# Patient Record
Sex: Female | Born: 1955 | Race: White | Marital: Married | Smoking: Never smoker
Health system: Northeastern US, Academic
[De-identification: ages and names within clinical notes are randomized; demographics above are authoritative.]

## PROBLEM LIST (undated history)

## (undated) DIAGNOSIS — G20 Parkinson's disease: Secondary | ICD-10-CM

## (undated) HISTORY — PX: APPENDECTOMY: SHX54

## (undated) HISTORY — DX: Parkinson's disease: G20

## (undated) HISTORY — PX: ANTERIOR CRUCIATE LIGAMENT REPAIR: SHX115

---

## 2013-09-06 ENCOUNTER — Other Ambulatory Visit: Payer: Self-pay | Admitting: Gastroenterology

## 2013-09-07 ENCOUNTER — Other Ambulatory Visit: Payer: Self-pay | Admitting: Gastroenterology

## 2013-11-01 ENCOUNTER — Ambulatory Visit: Payer: Self-pay | Admitting: Neurology

## 2013-11-01 ENCOUNTER — Encounter: Payer: Self-pay | Admitting: Neurology

## 2013-11-01 ENCOUNTER — Encounter: Payer: Self-pay | Admitting: Gastroenterology

## 2013-11-01 VITALS — BP 132/68 | HR 77 | Ht 66.0 in | Wt 135.0 lb

## 2013-11-01 DIAGNOSIS — G20 Parkinson's disease: Secondary | ICD-10-CM

## 2013-11-01 MED ORDER — CARBIDOPA-LEVODOPA 25-100 MG PO TABS *I*
1.0000 | ORAL_TABLET | Freq: Three times a day (TID) | ORAL | Status: DC
Start: 2013-11-01 — End: 2014-03-07

## 2013-11-01 NOTE — Patient Instructions (Signed)
It sounds like it would be a good idea to start Sinemet (the generic name is carbidopa/levodopa).  This is a medication that is taken up into the brain and made into dopamine (which is lacking in Parkinson's disease).  It is generally our most effective medication and is generally well tolerated.  The most common side effects are nausea or dizziness upon first standing up.  Any PD medication can cause confusion or hallucinations but these are rare early in the illness.  The medication is started at a very low dosage and then titrated up gradually.  We generally increase gradually to an initial goal of one tablet 3 times per day after a couple of weeks but you might end up needing a higher dosage (such as 2 tablets 3 times per day) - everyone is different.  You start with a 1/2 tablet in the morning for 3 days, then increase to 1/2 tablet twice a day for 3 days, then 1/2 tablet 3 times per day for 3 days, etc. As outlined in the table below.      Carbidopa/Levodopa (Sinemet) 25/100 Strength Tablet Titration Schedule    Day  AM NOON  PM    1-3 1/2      4-6 1/2  1/2    7-9 1/2 1/2 1/2    10-12 1 1/2 1/2    13-15 1 1/2 1    16  on 1 1 1      National Parkinson's foundation ([email or web address])    [facility] are both good websites to look at for information for Parkinson's disease.

## 2013-11-01 NOTE — Progress Notes (Addendum)
Movement Disorders Clinic-New Patient Visit    Dear Dr. Ike BeneGaffney,    CC: Tremors          We had the pleasure of seeing your patient Linda Arroyo in our [facility] today.  As you know she is a 58 y.o. right handed female who was referred to our clinic for evaluation of hand tremors (R>L) that started about 1 year ago.  She notices that it can occur at rest but also with writing (gets small) and difficulty with fine motor tasks such as buttoning and zippering but can still perform these.  She does not notice worsening of tremor when bringing a fork or spoon to her mouth but has some difficulty with twirling noodles on a fork.   She only has a drink of wine 1-2 times a month and has not noticed any decrease in tremor with alcohol.   She was started on propranolol about 6 months ago by her PCP and takes 10mg  on average once a week.  She takes it when she feels anxious or nervous but has not noticed much benefit with this. No family history of Parkinson's disease or essential tremor.         She also describes a dull aching in her forearms and tops of her legs and sometimes catches her foot on the stairs though because she did not lift her foot high enough.  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  She noticed in November right leg tripping up the stairs.  Her husband notes that she has been more apprehensive about riding her motorcycle and recently sold her motorcycle because of this.  He also notes that she seems slower to execute movements.  She says when she is tired she moves slower.    Symptom Review:  Falls or difficulty with balance: No falls occasional if side steps comes to close falling.  Orthostatic symptoms: None   Difficulty with memory: None  Hallucinations:None  Constipation: Yes only 1 BM a week on average especially the past few months.  She feels stool softeners have helped slightly but not increased  frequency of movements.  Urinary urgency/frequency: None  Incontinence:None  Difficulty with sleep:No difficulty falling asleep, but has been waking up during the night due to lower back pain from a buldging disc and has had PT which has helped.  This was recommended by a neurosurgeon.  Vividness or acting out of dreams: She talks in her sleep and moves around at night worsened since back pain.  Depression: None  Anxiety:None  Any difficulty with impulse control:None    16 point ROS was reviewed and scanned into the electronic medical record.    Medical History  Tremor  Past Surgical History   Procedure Laterality Date    Anterior cruciate ligament repair      Appendectomy         Medications  Current Outpatient Prescriptions   Medication Sig    Omega-3 Fatty Acids (OMEGA-3 FISH OIL PO) Take 2 capsules by mouth 2 times daily    CALCIUM LACTATE PO Take by mouth    carbidopa-levodopa (SINEMET) 25-100 MG per tablet Take 1 tablet by mouth 3 times daily       Allergies  Allergies   Allergen Reactions    Fish-Derived Products Anaphylaxis     Sea bass anaphylactic shock    Penicillins Hives  Family History  Family History   Problem Relation Age of Onset    Stroke Mother     Dementia Mother     Dementia Maternal Grandmother     Stroke Paternal Grandmother        Social History  History   Substance Use Topics    Smoking status: Never Smoker     Smokeless tobacco: Not on file    Alcohol Use: 0.6 oz/week     0 Not specified, 1 Glasses of wine per week      Comment: 1-2 glasses of wine per month       Exam  Filed Vitals:    11/01/13 0855   BP: 132/68   Pulse: 77   Height:    Weight:    Patient was in the OFF state  Mental Status: she had normal comprehension and fund of knowledge  Cranial Nerves: Extraocular muscles were full range, normal saccades, and no nystagmus.  Face was symmetric and there was mild hypomimia, no significant hypophonia, or dysarthria.  Tongue was midline and palate elevated equally.    Motor: Muscle bulk was normal with mild increased rigidity in the right upper extremity.  Finger taps, pronation/supination, heel taps were slowed right greater then left.  There was no resting, action, or postural tremor present on our exam today. There was mild bradykinesia.  Sensory: Intact to light touch and vibration throughout.  Reflexes:Biceps 2+/2+, brachioradialis 2+/2+, triceps 2+/2+, patellar 2+/2+, achilles 2+/2+.   Coordination: Finger nose finger was intact.  Gait: she was able to stand with arms crossed from a seated position, retropulsion was seen on pull test but recovered unaided. Gait was slightly slowed with mild decrease in arm swing on the right.    PART III MOTOR EXAMINATION  INDICATE MEDICATION STATE:: Not on medications.  18. SPEECH:: Normal   19.  FACIAL EXPRESSION:: Normal  20. A.) TREMOR AT REST: FACE, LIPS + CHIN: Normal  20.B.) Tremor at Rest: Right Hand:: Normal  20. C) Tremor at Rest: LEFT HAND:: Normal  20. D.) Tremor at Rest: RIGHT FOOT:: Normal  20. E.) Tremor at Rest: LEFT FOOT:: Normal  21. A.) Action or Postural Tremor of Hands: RIGHT HAND:: Absent  21. B.)  Action or Postural Tremor of Hands: LEFT HAND:: Slight, present with action.  22. A.) Rigidity:  Neck:: Mild-to-moderate.  22. B.) Rigidity:  RUE:: Mild-to-moderate.  22. C.) Rigidity: LUE:: Slight or detectable only when activated by mirror or other movements.  22. D.)  Rigidity:  RLE:: Slight or detectable only when activated by mirror or or other movements.  22. E.) Rigidity:  LLE:: Slight or detectable only when actiivated by mirror or other movements.  23. A.) Bradykinesia: Finger Taps Right Hand:: Mild slowing and/or reduction in amplitude  23. B.) Bradykinesia: Finger Taps: LEFT HAND:: Mild slowing and/or reduction in amplitude  24. A.)  Bradykinesia:  RIGHT HAND MOVEMENTS:: Mild slowing and/or reduction in amplitude.  24. B.) Bradykinesia: LEFT HAND MOVEMENTS:: Normal  25. A.) Bradykinesia: RAPID ALTERNATING  MOVEMENTS OF RIGHT HAND (PRONATION-SUPINATION):: Normal  25 B.) Bradykinesia: RAPID ALERNATING MOVEMENTS OF LEFT HAND (PRONATION-SUPINATION):: Normal  26. A.)  Bradykinesia: RIGHT LEG AGILITY:: Mild slowing and/or reduction in amplitude.  26. B.) Bradykinesia: LEFT LEG AGILITY:: Mild slowing and/or reduction in amplitude.  27.  ARISING FROM CHAIR:: Normal  28.  POSTURE:: Normal erect  29. GAIT:: Normal  30.  POSTURAL STABILITY:: Retropulsion, but recovers unaided.  31. BODY BRADYKINESIA AND HYPOKINESIA:: Minimal slowness, giving,  movement a deliberate character, could be normal for some persons. Possibly, reduced amplitude.  PART III   TOTAL:: 15    Assessment  1. Probable PD.  Mild-moderately impaired finger and foot tapping, right greater than left, mild RUE rigidity and report of tremor by history (though not observed on exam today).  We discussed approaches toward therapy and, given the functional impact of her symptoms, decided to start Sinemet. We answered all her questions regarding PD today and sent the script to her pharmacy for sinemet.    Plan  -start sinemet 25/100mg  and titrate up to 1 tab TID over 2-3 weeks as directed  -further increases as necessary and tolerated  -follow up in clinic in 3 months     Thank you for allowing us to participate in the care of your patient.    Sincerely,    Tacey RuizKelly Andrzejewski D.O., Ph.D.      Patient was seen with attending physician Dr. Gerlene Burdockichard.    Neurology Attending Addendum    I saw and evaluated the patient. I agree with the fellow's findings and plan of care as documented above.    Glena NorfolkIRENE Jackelyn Illingworth, MD

## 2013-12-05 ENCOUNTER — Telehealth: Payer: Self-pay | Admitting: Neurology

## 2013-12-05 NOTE — Telephone Encounter (Signed)
Spoke with Linda Arroyo. She is wondering if she needs to continue taking her propanolol for tremor now that she was started on c/l and her symptoms including her tremor have improved. Discussed that propanolol is a beta blocker that works in the heart and lowers BP, and HR but also helps with tremor. Advised she continues c/l and not take propanolol as it may lower BP and HR, as c/l can lower BP as well. Advised she calls her PCP who prescribed the propanolol and checks with him if she even needs to keep taking it.

## 2013-12-05 NOTE — Telephone Encounter (Signed)
Pt has questions regarding meds please call her at [phone]

## 2014-03-07 ENCOUNTER — Ambulatory Visit: Payer: Self-pay | Admitting: Neurology

## 2014-03-07 ENCOUNTER — Encounter: Payer: Self-pay | Admitting: Neurology

## 2014-03-07 VITALS — BP 134/68 | HR 94 | Ht 66.0 in | Wt 133.0 lb

## 2014-03-07 DIAGNOSIS — G20 Parkinson's disease: Secondary | ICD-10-CM | POA: Insufficient documentation

## 2014-03-07 MED ORDER — CARBIDOPA-LEVODOPA 25-100 MG PO TABS *I*
1.0000 | ORAL_TABLET | Freq: Three times a day (TID) | ORAL | Status: DC
Start: 2014-03-07 — End: 2014-12-03

## 2014-03-07 NOTE — Patient Instructions (Addendum)
For your sleep, you can try melatonin at bedtime, which you can get over-the-counter.  Start with 3mg  at bedtime.  You can increase by 3mg  every 4-5 days as needed to a maximum of 12mg  at bedtime.    Continue exercising regularly!

## 2014-03-07 NOTE — Progress Notes (Signed)
Dear Dr. Tinnie GensKlepack,    We recently had the opportunity to see Linda Arroyo at the [facility] for follow up of Parkinson's disease.  She is accompanied by her husband.    She was last seen here in October of 2015 at which time she was started on carbidopa-levodopa 25-100mg  1 tab TID for probable Parkinson's disease.  Since then, things have been going really well.  She is moving pretty well.  She has a ruptured disc in her lower back so is often stiff in her back in the morning and at the end of the day.  She says her resting tremor may be a little more pronounced but tremors with action have lessened.    She denies constipation, urinary problems.  She sleeps "pretty good."  She wakes up during the night but is unsure why.  She does shout out in sleep.  She denies depression or anxiety.  She denies changes in thinking or memory--she is no longer having "fogginess" of thinking.  She walks for exercise and is doing physical therapy for her back.    Current Parkinson disease medications:   Carbidopa-levodopa 25-100mg  1 tab TID   She is tolerated the medication well.  She denies dyskinesias, hallucinations, nausea or lightheadedness with standing.    The patient's medications, allergies, and active problem list were reviewed and updated as appropriate elsewhere in the electronic medical record.    REVIEW OF SYSTEMS: She completed a 15 point review of systems questionnaire that I reviewed today and that will be scanned into the electronic medical record.    EXAMINATION:  Filed Vitals:    03/07/14 1247 03/07/14 1249   BP: 146/88 134/68   Pulse: 75 94   Height: 1.676 m (5\' 6" )    Weight: 60.328 kg (133 lb)        GENERAL EXAM:  She appears comfortable.    NEUROLOGICAL EXAM:  MENTAL STATUS:  She is awake and alert.  She is oriented to conversation. Speech is fluent.  Comprehension is intact.  Affect is appropriate.      CRANIAL NERVES:  Ocular versions are full and conjugate.  There is no nystagmus.   Pursuits are smooth.  Saccades are intact.  Facial expression is symmetric.  There is no hypomimia and no hypophonia.  The palate elevates symmetrically with phonation.  There is no dysarthria. Shoulder shrug is symmetric.  The tongue protrudes in the midline.    MOTOR:  There is mild rigidity in the right wrist with augmentation only.  There was no rigidity in the lower extremities.   Finger taps, hand open-close, and pronation supination are mildly impaired, right greater than left. Toe taps and heel taps are normal bilaterally. There are no rest, action or postural tremors.    COORDINATION:  No ataxia evident on finger-nose-finger testing.    GAIT:  She is able to rise from a chair with arms crossed without difficulty.  Casual gait is stable with normal base and stride length.  Arm swing is reduced on the right greater than left.  Takes 1 step backwards on pull test.    IMPRESSION:  Linda Arroyo is a 59 y.o. female with probable idiopathic Parkinson's disease, doing well on low-dosage Sinemet.  She is having some difficultly staying asleep and endorses mild REM sleep behavior disorder symptoms.    PLAN:  - continue Sinemet 1 tab TID  - start melatonin 3mg  at bedtime to help with sleep; this can be increased in   increments up to  at bedtime as needed  - encouraged exercise  - follow up in 6 months    Thank you for the opportunity to provide care for your patient, who was seen with attending physician, Dr. Glena Norfolk.      Duanne Guess, MD  Movement Disorders Fellow    Neurology Attending Addendum    I saw and evaluated the patient. I agree with the fellow's findings and plan of care as documented above.    Glena Norfolk, MD

## 2014-05-16 ENCOUNTER — Inpatient Hospital Stay: Admit: 2014-05-16 | Discharge: 2014-05-16 | Disposition: A | Discharge status: Home / Self Care | Payer: Self-pay

## 2014-06-24 ENCOUNTER — Other Ambulatory Visit: Payer: Self-pay | Admitting: Neurology

## 2014-12-03 ENCOUNTER — Ambulatory Visit: Payer: Self-pay | Admitting: Neurology

## 2014-12-03 ENCOUNTER — Encounter: Payer: Self-pay | Admitting: Neurology

## 2014-12-03 VITALS — BP 109/74 | HR 91 | Ht 66.0 in | Wt 139.0 lb

## 2014-12-03 DIAGNOSIS — G20 Parkinson's disease: Secondary | ICD-10-CM

## 2014-12-03 MED ORDER — CARBIDOPA-LEVODOPA 25-100 MG PO TABS *I*
1.0000 | ORAL_TABLET | Freq: Three times a day (TID) | ORAL | 4 refills | Status: DC
Start: 2014-12-03 — End: 2015-12-03

## 2014-12-03 NOTE — Patient Instructions (Signed)
No changes to your medications.    Your blood pressure today. We recommend you drink 8 glasses of water day and monitor your BP for a week or so. Call if you experience more dizziness or you faint.

## 2014-12-03 NOTE — Progress Notes (Signed)
Dear Dr. Tinnie Gens,  Movement Disorder Clinic Follow Up Visit  We had the pleasure of seeing your patient Linda Arroyo in the Movement disorders clinic unaccompanied for follow up of Parkinson disease.      HPI  Linda Arroyo was last seen in February at which time no medication changes were made. Since then she thinks her symptoms are about the same.  She is not experiencing any side effects. She thinks her tremors is activated first thing in the morning, if she is tired, stressed or at at the end of the day.  She continues to stay active by walking 4 miles several times a week.  She also does yoga and modifies the poses as needed. She feels well and thinks the medicine is helpful. She is taking carbidopa/levodopa 25/100 1 tablet three times daily. She denies fall, LH, hallucinations, or impulse control problems.     Current Parkinson disease medications:   Carbidopa-levodopa 25-100mg  1 tab TID   She is tolerated the medication well. She denies dyskinesias, hallucinations, nausea or lightheadedness with standing    Symptom Review:  Difficulties swallowing/dysphagia: No choking   Falls or difficulty with balance: No  Orthostatic symptoms: dizziness a couple of spells during the summer, but none recently  Difficulty with memory: word finding   Hallucinations: No  Constipation: No  Urinary urgency/frequency: No, 1x up to use the batroom  Incontinence: No  Difficulty with sleep: falling asleep   Vividness or acting out of dreams: talks in her sleep  Depression: No  Anxiety: some anxiety  Any difficulty with impulse control: No  Any fluctuations in medication response: No  Dyskinesias: No    Review of Systems   She completed a 15 point review of systems questionnaire that I reviewed today and that will be scanned into the electronic medical record.     Medications  Current Outpatient Prescriptions   Medication Sig    carbidopa-levodopa (SINEMET) 25-100 MG per tablet TAKE 1 TABLET BY MOUTH THREE TIMES DAILY    carbidopa-levodopa  (SINEMET) 25-100 MG per tablet Take 1 tablet by mouth 3 times daily    Omega-3 Fatty Acids (OMEGA-3 FISH OIL PO) Take 2 capsules by mouth 2 times daily    CALCIUM LACTATE PO Take by mouth       Exam  Vitals:    12/03/14 1431 12/03/14 1437   BP: 118/79 109/74   BP Location: Right arm Right arm   Patient Position: Sitting Standing   Cuff Size: adult adult   Pulse: 88 91   Weight: 63 kg (139 lb)    Height: 1.676 m ( )        Mental Status:  Alert and fully oriented with normal language and comprehension.  Mood euthymic, no evidence of psychosis    Cranial Nerves: Ocular versions are full and conjugate. There is no nystagmus.  Pursuits are smooth. There is no hypomimia and no hypophonia. There is no dysarthria.     Motor: There is mild rigidity in the right wrist with augmentation only. There was no rigidity in the lower extremities. Finger taps, hand open-close, and pronation supination are mildly impaired, right greater than left. Heel taps taps are normal bilaterally. There are no rest, action or postural tremors.    Coordination: No ataxia evident on finger-nose-finger testing.    Gait: She is able to rise from a chair with arms crossed without difficulty. Casual gait is stable with normal base and stride length. Arm swing is reduced on the right  greater than left. She takes 1 step backwards on pull test.    IMPRESSION:  Linda Arroyo is a 59 y.o. female with probable idiopathic Parkinson's disease, doing well on low-dosage Sinemet. She continues to have diffiuclty staying asleep and endorses mild REM sleep behavior disorder symptoms.     Plan  1) continue Sinemet 1 tab TID    2) Increase Melatonin to 6 mg for REM sleep behavior disorder    3) Encouraged exercise      We have asked Linda Arroyo to return for follow up in 6 months but to call sooner with concerns or questions.    Thank you for allowing me to participate in the care of your patient.    Sincerely,    Sande BrothersAida Mychal Decarlo, AGNP

## 2015-06-02 ENCOUNTER — Ambulatory Visit: Payer: Self-pay | Admitting: Neurology

## 2015-06-18 ENCOUNTER — Encounter: Payer: Self-pay | Admitting: Neurology

## 2015-06-18 ENCOUNTER — Ambulatory Visit: Payer: Self-pay | Admitting: Neurology

## 2015-06-18 VITALS — BP 131/67 | HR 80 | Ht 66.0 in | Wt 138.0 lb

## 2015-06-18 DIAGNOSIS — G20 Parkinson's disease: Secondary | ICD-10-CM

## 2015-06-18 NOTE — Patient Instructions (Addendum)
We recommend you add Miralax daily initially and if you get too regular, may reduce to 3 times a week.     Continue to drink plenty of water and exercise daily for constipation.    Consider the [facility] [place].

## 2015-06-18 NOTE — Progress Notes (Signed)
Dear Dr. Tinnie Arroyo,  Movement Disorder Clinic Follow Up Visit  We had the pleasure of seeing your patient Linda Arroyo in the Movement disorders clinic accompanied for follow up of Parkinson disease.       HPI  Linda Arroyo was last seen in November 2016, at which time no medication changes were made. Since then, she thinks her symptoms are about the same.  She participated in the movement study through the [employer] [facility] at the [facility] in [place].  She found study to be a great experience and learned a lot about Parkinson's disease and the role of exercise and helping to keep the disease at the.  She is motivated to continue to exercise and strengthen her legs.  She thinks her tremor is activated first thing in the morning, if she is tired, stressed or at at the end of the day.  She continues to stay active by walking 4 miles several times a week. She feels well and thinks the medicine is helpful. She is taking carbidopa/levodopa 25/100 1 tablet three times daily. She denies problems swallowing, falls, hallucinations, or impulse control problems.  Her mood is good and is very optimistic.    Current Parkinson disease medications:   Carbidopa-levodopa 25-100mg  1 tab TID   She is tolerated the medication well. She denies dyskinesias, hallucinations,   nausea or lightheadedness with standing.      Symptom Review:  Difficulties swallowing/dysphagia: No  Falls or difficulty with balance: No  Orthostatic symptoms: occasionally she feels dizziness, very short  Difficulty with memory: No  Hallucinations: No  Constipation: yes, Magnesium  Urinary urgency/frequency: No  Incontinence: No  Difficulty with sleep:still some, Melatonin 10 mg  Vividness or acting out of dreams: No  Depression: No  Anxiety: once in a while she is anxious about work projects, but does not impact function or performance at work  Any difficulty with impulse control: No  Any fluctuations in medication response: No  Dyskinesias:  No    Review of Systems   She completed a 15 point review of systems questionnaire that I reviewed today and that will be scanned into the electronic medical record.     Medications  Current Outpatient Prescriptions   Medication Sig    carbidopa-levodopa (SINEMET) 25-100 MG per tablet Take 1 tablet by mouth 3 times daily    Omega-3 Fatty Acids (OMEGA-3 FISH OIL PO) Take 2 capsules by mouth 2 times daily    CALCIUM LACTATE PO Take by mouth       Exam  Vitals:    06/18/15 1448 06/18/15 1450   BP: 138/73 131/67   BP Location: Left arm Left arm   Patient Position: Sitting Standing   Cuff Size: adult adult   Pulse: 70 80   Weight: 62.6 kg (138 lb)    Height: 1.676 m (5\' 6" )        Mental Status:  Alert and fully oriented with normal language and comprehension.  Mood euthymic, no evidence of psychosis    Cranial Nerves: Ocular versions are full and conjugate. There is no nystagmus. Pursuits are smooth. There is no hypomimia and no hypophonia. There is no dysarthria.     Motor: There is mild rigidity in the right wrist with augmentation only. There was no rigidity in the lower extremities. Finger taps, hand open-close, pronation supination, are mildly impaired, left greater than right. Heel and toe taps are normal bilaterally. There are no rest, action or postural tremors.  Coordination: No ataxia evident on finger-nose-finger testing.    Gait: She is able to rise from a chair with arms crossed without difficulty. Casual gait is stable with normal base and stride length. Arm swing is reduced on the right greater than left. She takes 1 step backwards on pull test.    IMPRESSION:  Linda Arroyo is a 60 y.o. female with probable idiopathic Parkinson's disease, doing well on her current Sinemet regimen. She continues to have diffiuclty staying asleep and endorses mild REM sleep behavior disorder symptoms. We suggest she increases Melatonin to 12 mg for RBD. For constipation, suggested increased fluids, fiber and  daily Miralax. We encouraged her to continue her daily exercise program.     Plan  1) Continue Sinemet 1 tab TID    2) Increase Melatonin to 12 mg for REM sleep behavior disorder    3)  For constipation, suggested increased fluids, fiber and daily Miralax.      We have asked Linda Arroyo to return for follow up in 6 months but to call sooner with concerns or questions.    Thank you for allowing me to participate in the care of your patient.    Sincerely,    Sande Brothers, AGNP

## 2015-12-03 ENCOUNTER — Other Ambulatory Visit: Payer: Self-pay | Admitting: Neurology

## 2015-12-03 DIAGNOSIS — G20 Parkinson's disease: Secondary | ICD-10-CM

## 2015-12-03 MED ORDER — CARBIDOPA-LEVODOPA 25-100 MG PO TABS *I*
1.0000 | ORAL_TABLET | Freq: Three times a day (TID) | ORAL | 4 refills | Status: DC
Start: 2015-12-03 — End: 2016-03-25

## 2016-01-01 ENCOUNTER — Ambulatory Visit: Payer: Self-pay | Admitting: Neurology

## 2016-03-25 ENCOUNTER — Other Ambulatory Visit: Payer: Self-pay | Admitting: Neurology

## 2016-03-25 DIAGNOSIS — G20 Parkinson's disease: Secondary | ICD-10-CM

## 2016-05-11 ENCOUNTER — Ambulatory Visit: Payer: Self-pay | Admitting: Neurology

## 2016-05-24 ENCOUNTER — Ambulatory Visit: Payer: PRIVATE HEALTH INSURANCE | Attending: Neurology | Admitting: Neurology

## 2016-05-24 ENCOUNTER — Encounter: Payer: Self-pay | Admitting: Neurology

## 2016-05-24 VITALS — BP 121/77 | HR 81 | Ht 66.0 in | Wt 138.0 lb

## 2016-05-24 DIAGNOSIS — G20 Parkinson's disease: Secondary | ICD-10-CM

## 2016-05-24 NOTE — Progress Notes (Addendum)
[facility] Follow-up Note    Dear Dr. Albina BilletKlepack, William, MD,    We had the pleasure of seeing Linda Arroyo, a pleasant 61 y.o. woman in follow-up in the [facility] Disorders [facility] today for Parkinson's Disease. She was accompanied by her husband.    She was previously assessed in May 2017 and she has remained stable since. There has been no significant progression or worsening of her symptoms. Her tremor is well controlled. There is no imbalance and there have been no falls. She denies dysphagia. There have been no hallucinations, confusion, impulsive behaviors, or dyskinesias. She continues to take 1 tab of Sinemet TID and there is no wearing-off.     She reports occasional orthostatic dizziness. She has "some anxiety" which is not too bad. No depression. She has had some symptoms of RBD in the past which have not recurred recently. She takes melatonin occasionally. Her constipation is also well-controlled right now.    She tries to stay active and goes on walks regularly.    Current Parkinson disease medications:   Carbidopa-levodopa 25-100mg  1 tab TID       Home Medications:  Prior to Admission medications    Medication Sig Start Date End Date Taking? Authorizing Provider   traMADol (ULTRAM) 50 MG tablet Take 50 mg by mouth as needed for Pain   Yes [provider]   carbidopa-levodopa (SINEMET) 25-100 MG per tablet TAKE 1 TABLET BY MOUTH THREE TIMES DAILY 03/25/16  Yes Leretha PolSantiago, Aida L, NP   Omega-3 Fatty Acids (OMEGA-3 FISH OIL PO) Take 2 capsules by mouth 2 times daily   Yes [provider]   CALCIUM LACTATE PO Take by mouth   Yes [provider]   MAGNESIUM LACTATE PO Take 400 mg by mouth daily    [provider]        ROS : Twelve point review of systems completed and reviewed with patient with pertinent positives reported above. Please see scanned ROS page in Media section for further detail.     Objective:  Vitals:    05/24/16 1110 05/24/16 1113   BP: 148/84  121/77   BP Location: Left arm Left arm   Patient Position: Sitting Standing   Cuff Size: adult adult   Pulse: 68 81   Weight: 62.6 kg (138 lb)    Height: 1.676 m (5\' 6" )      Gen: Sitting in chair comfortably; NAD.    Neurological Examination: Examination is conducted approximately 4 hours after patient's previous dose of parkinson's medication(s).     Mental Status:  She is awake and alert with normal attention. Language function is judged to be normal.    Cranial Nerves:  Pupils are both equal, round and reactive to light and extraocular muscles are intact. Vertical gaze is intact. Ocular pursuits and saccadic eye movements are normal. There is no nystagmus. There is no facial asymmetry. There is slight hypomimia and perhaps slight hypophonia. There is no rigidity of the neck musculature. Shoulder shrug is symmetric bilaterally.    Motor: There is normal muscle bulk. There is no focal weakness of the extremities. There is slight rigidity at the UEs with augmentation.  Finger taps and hand opening/closure are mild-to-mod impaired on the right and slightly impaired on the left. Pronation-supination are slightly slow bilaterally. Heel and toe taps are normal. There is no significant rest, postural or kinetic tremor. There are no dyskinesias.    Sensory: There are no gross sensory abnormalities to light  touch.    Coordination: There is no dysmetria on FTN.    Gait: She is able to rise from chair with arms crossed. Gait is comprised of normal initiation and stride length with mildly reduced arm swing. Turning is stable. Pull test negative.      IMPRESSION AND PLAN:  Linda Arroyo is a 61 y.o. woman with probable idiopathic Parkinson's Disease who is currently doing very well. She is on low-dose Sinemet (1 tab TID) and tolerating it without difficulty. She denies any wearing-off. We have therefore asked her to continue with the same dose of Sinemet.    She does have some orthostatic drop in her blood pressure, for which  we have suggested increasing fluid intake. We will plan to reassess her in about 6 months.     The patient was seen and the plan was discussed with the attending physician, Dr. Gerlene Burdock.    Adolph Pollack, MD  Movement Disorders Fellow    Neurology Attending Addendum    I saw and evaluated the patient. I agree with the fellow's findings and plan of care as documented above.    Glena Norfolk, MD

## 2016-06-02 ENCOUNTER — Inpatient Hospital Stay: Admit: 2016-06-02 | Discharge: 2016-06-02 | Disposition: A | Discharge status: Home / Self Care | Payer: Self-pay

## 2016-06-02 ENCOUNTER — Other Ambulatory Visit: Payer: Self-pay | Admitting: Gastroenterology

## 2016-06-30 ENCOUNTER — Encounter: Payer: Self-pay | Admitting: Gastroenterology

## 2016-07-13 ENCOUNTER — Ambulatory Visit
Admission: RE | Admit: 2016-07-13 | Discharge: 2016-07-13 | Disposition: A | Discharge status: Home / Self Care | Payer: PRIVATE HEALTH INSURANCE | Source: Ambulatory Visit | Attending: Orthopedic Surgery | Admitting: Orthopedic Surgery

## 2016-07-13 ENCOUNTER — Ambulatory Visit: Payer: PRIVATE HEALTH INSURANCE | Admitting: Orthopedic Surgery

## 2016-07-13 ENCOUNTER — Encounter: Payer: Self-pay | Admitting: Orthopedic Surgery

## 2016-07-13 ENCOUNTER — Other Ambulatory Visit: Payer: Self-pay

## 2016-07-13 VITALS — BP 181/86 | HR 76 | Ht 66.0 in | Wt 137.0 lb

## 2016-07-13 DIAGNOSIS — M4310 Spondylolisthesis, site unspecified: Secondary | ICD-10-CM

## 2016-07-13 DIAGNOSIS — M48061 Spinal stenosis, lumbar region without neurogenic claudication: Secondary | ICD-10-CM

## 2016-07-13 DIAGNOSIS — M5136 Other intervertebral disc degeneration, lumbar region: Secondary | ICD-10-CM | POA: Insufficient documentation

## 2016-07-13 DIAGNOSIS — M4316 Spondylolisthesis, lumbar region: Secondary | ICD-10-CM | POA: Insufficient documentation

## 2016-07-13 DIAGNOSIS — M5137 Other intervertebral disc degeneration, lumbosacral region: Secondary | ICD-10-CM | POA: Insufficient documentation

## 2016-07-13 NOTE — Progress Notes (Signed)
Patient has a long history of back pain radiating to her legs.  She's from [place].  She is here for surgical consultation.  She's not gone better with exercises medications and rest.  She's had her pain for over 2 years.  She has difficulty walking she has difficulty standing.  She reports pain rating from her back down both of her legs.    Past Medical History:   Diagnosis Date    Parkinson's disease      Vitals:    07/13/16 1123   BP: (!) 181/86   Pulse: 76   Weight: 62.1 kg (137 lb)   Height: 1.676 m (5\' 6" )     Physical examination she is a healthy-appearing 61 year old female she has a slow and steady gait.  She has reduced range of motion back secondary to stiffness and pain.  EHLs 4+ over 5 bilaterally.  She has numbness over the L5 dermatome bilaterally straight leg raising is normal.  The remainder of her motor and sensory exams within normal limits.  Reflexes were the knees and ankles hips and knees painless range of motion DP pulses palpable.    Imaging studies MRI shows very severe L4-5 spinal stenosis with grade 2 degenerative spinal ceases.  His severe disc degeneration at L5-S1.    Assessment plan:  61 year old female with severe L4-5 degenerative spondylolisthesis and spinal stenosis.  She also severe disc generation of L5-S1.  She's candidate for L4-L5 limited laminotomy surgery along with instrumented L4-S1 posterior lateral fusion using iliac crest and pedicle screws.    I told her this operation is an option for her based on her quality-of-life.  The decision to proceed with surgery is purely her's.  I explained the risks and benefits of the surgery with the patient.  The risks include infection, nerve root damage, bleeding, paralysis, death, stroke, chronic pain, wound healing problems, need for reoperation, and the need for additional surgery.  I also informed the patient that there was no guarantee that the surgery would be successful.  I answered all the patient's questions to the level  of satisfaction.

## 2016-07-14 ENCOUNTER — Other Ambulatory Visit: Payer: Self-pay | Admitting: Orthopedic Surgery

## 2016-07-14 DIAGNOSIS — Z01818 Encounter for other preprocedural examination: Secondary | ICD-10-CM

## 2016-07-15 ENCOUNTER — Encounter: Payer: Self-pay | Admitting: Gastroenterology

## 2016-07-15 ENCOUNTER — Other Ambulatory Visit: Payer: Self-pay | Admitting: Orthopedic Surgery

## 2016-07-15 ENCOUNTER — Encounter: Payer: Self-pay | Admitting: Orthopedic Surgery

## 2016-07-22 ENCOUNTER — Other Ambulatory Visit: Payer: Self-pay | Admitting: Orthopedic Surgery

## 2016-07-22 NOTE — Progress Notes (Signed)
Telephone Encounter for MRI Followup:      I have spoken with Linda Arroyo ([age] y.o. female) 07/20/2016 to review her upcoming spinal surgery.     She is scheduled to have a L4-L5 limited laminotomy surgery along with instrumented L4-S1 posterior lateral fusion using iliac crest and pedicle screws with Dr. Ronney AstersMolinari on 07/26/2016.    We have discussed her upcoming surgery in great detail. We have also discussed risks versus benefits.    Risks are including but not limited to: Infection, blood loss, blood clots, dural tear, damage to blood vessels, damage to nerves up to and including temporary or permanent paralysis, risks of anesthesia up to and including stroke, blindness or death. Pain, incomplete relief of pain, need for additional surgery, failure of surgery, leg weakness and footdrop, bowel or bladder impairment, epidural hematoma.    She understands these risks and is still willing to proceed with surgery.    I have answered all of her questions to her satisfaction, she is agreeable to the treatment plan.

## 2016-07-22 NOTE — Preop H&P (Addendum)
OUTPATIENT  Chief Complaint: back pain radiating to her legs.    History of Present Illness:  HPI Comments:   Linda Arroyo is a 61 y.o. female with a history of difficulty walking she has difficulty standing.  She reports pain rating from her back down both of her legs.       Preoperative imaging studies: MRI shows very severe L4-5 spinal stenosis with grade 2 degenerative spinal stenosis with severe disc degeneration at L5-S1.      She has elected to proceed with surgical intervention as she has  exhausted conservative measures and her quality of life is poor.      Past Medical History:   Diagnosis Date    Parkinson's disease      Past Surgical History:   Procedure Laterality Date    ANTERIOR CRUCIATE LIGAMENT REPAIR      APPENDECTOMY       Family History   Problem Relation Age of Onset    Stroke Mother     Dementia Mother     Dementia Maternal Grandmother     Stroke Paternal Grandmother      Social History     Social History    Marital status: Married     Spouse name: N/A    Number of children: N/A    Years of education: N/A     Social History Main Topics    Smoking status: Never Smoker    Smokeless tobacco: Never Used    Alcohol use 0.6 oz/week     1 Glasses of wine, 0 Standard drinks or equivalent per week      Comment: 1-2 glasses of wine per month    Drug use: No    Sexual activity: Not on file     Other Topics Concern    Not on file     Social History Narrative       Allergies:   Allergies   Allergen Reactions    Fish-Derived Products Anaphylaxis     Sea bass anaphylactic shock    Penicillins Hives       No current facility-administered medications for this encounter.      Current Outpatient Prescriptions   Medication    traMADol (ULTRAM) 50 MG tablet    carbidopa-levodopa (SINEMET) 25-100 MG per tablet    CALCIUM LACTATE PO    MAGNESIUM LACTATE PO    Omega-3 Fatty Acids (OMEGA-3 FISH OIL PO)        Review of Systems:   ROS    Last Nursing documented pain:        No data found.                 Physical Exam Please refer to PCP H&P.     Lab Results:   All labs in the last 72 hours:No results found for this or any previous visit (from the past 72 hour(s)).    Radiology impressions (last 3 days):  No results found.    Currently Active/Followed Hospital Problems:  There are no active hospital problems to display for this patient.      Assessment: severe L4-5 degenerative spondylolisthesis and spinal stenosis. Severe disc generation of L5-S1.            Plan:  L4-L5 limited laminotomy surgery along with instrumented L4-S1 posterior lateral fusion using iliac crest and pedicle screws.    Author: Earnestine LeysWILLIAM GRUHN, PA  Note created: 07/22/2016  at: 8:08 AM   I explained the risks and benefits of  the surgery with the patient.  The risks include infection, nerve root damage, bleeding, paralysis, death, stroke, chronic pain, wound healing problems, need for reoperation, and the need for additional surgery.  I also informed the patient that there was no guarantee that the surgery would be successful.  I answered all the patient's questions to the level of satisfaction.  We will proceed with the operation.

## 2016-07-26 ENCOUNTER — Inpatient Hospital Stay
Admission: RE | Admit: 2016-07-26 | Discharge: 2016-07-29 | DRG: 304 | Disposition: A | Discharge status: Home / Self Care | Payer: PRIVATE HEALTH INSURANCE | Source: Ambulatory Visit | Attending: Orthopedic Surgery | Admitting: Orthopedic Surgery

## 2016-07-26 ENCOUNTER — Inpatient Hospital Stay: Payer: PRIVATE HEALTH INSURANCE | Admitting: Neurosurgery

## 2016-07-26 ENCOUNTER — Inpatient Hospital Stay: Payer: PRIVATE HEALTH INSURANCE

## 2016-07-26 ENCOUNTER — Encounter
Admission: RE | Disposition: A | Discharge status: Home / Self Care | Payer: Self-pay | Source: Ambulatory Visit | Attending: Orthopedic Surgery

## 2016-07-26 DIAGNOSIS — M48062 Spinal stenosis, lumbar region with neurogenic claudication: Secondary | ICD-10-CM

## 2016-07-26 DIAGNOSIS — G20 Parkinson's disease: Secondary | ICD-10-CM

## 2016-07-26 DIAGNOSIS — M4807 Spinal stenosis, lumbosacral region: Secondary | ICD-10-CM | POA: Diagnosis present

## 2016-07-26 DIAGNOSIS — M4316 Spondylolisthesis, lumbar region: Secondary | ICD-10-CM | POA: Diagnosis present

## 2016-07-26 DIAGNOSIS — M48061 Spinal stenosis, lumbar region without neurogenic claudication: Principal | ICD-10-CM | POA: Diagnosis present

## 2016-07-26 DIAGNOSIS — M4317 Spondylolisthesis, lumbosacral region: Secondary | ICD-10-CM | POA: Diagnosis present

## 2016-07-26 DIAGNOSIS — M5416 Radiculopathy, lumbar region: Secondary | ICD-10-CM | POA: Diagnosis present

## 2016-07-26 HISTORY — PX: PR ARTHRODESIS POSTERIOR/PSTLAT TQ 1NTRSPC LUMBAR: 22612

## 2016-07-26 LAB — CBC AND DIFFERENTIAL
Baso # K/uL: 0 10*3/uL (ref 0.0–0.1)
Basophil %: 0.1 %
Eos # K/uL: 0 10*3/uL (ref 0.0–0.4)
Eosinophil %: 0 %
Hematocrit: 27 % — ABNORMAL LOW (ref 34–45)
Hemoglobin: 9.1 g/dL — ABNORMAL LOW (ref 11.2–15.7)
IMM Granulocytes #: 0.1 10*3/uL (ref 0.0–0.1)
IMM Granulocytes: 0.6 %
Lymph # K/uL: 1.3 10*3/uL (ref 1.2–3.7)
Lymphocyte %: 12.8 %
MCH: 31 pg/cell (ref 26–32)
MCHC: 34 g/dL (ref 32–36)
MCV: 92 fL (ref 79–95)
Mono # K/uL: 1 10*3/uL — ABNORMAL HIGH (ref 0.2–0.9)
Monocyte %: 9.9 %
Neut # K/uL: 8 10*3/uL — ABNORMAL HIGH (ref 1.6–6.1)
Nucl RBC # K/uL: 0 10*3/uL (ref 0.0–0.0)
Nucl RBC %: 0 /100 WBC (ref 0.0–0.2)
Platelets: 257 10*3/uL (ref 160–370)
RBC: 2.9 MIL/uL — ABNORMAL LOW (ref 3.9–5.2)
RDW: 11.9 % (ref 11.7–14.4)
Seg Neut %: 76.6 %
WBC: 10.4 10*3/uL — ABNORMAL HIGH (ref 4.0–10.0)

## 2016-07-26 LAB — BASIC METABOLIC PANEL
Anion Gap: 12 (ref 7–16)
CO2: 26 mmol/L (ref 20–28)
Calcium: 8.6 mg/dL (ref 8.6–10.2)
Chloride: 99 mmol/L (ref 96–108)
Creatinine: 1.12 mg/dL — ABNORMAL HIGH (ref 0.51–0.95)
GFR,Black: 61 *
GFR,Caucasian: 53 * — AB
Glucose: 122 mg/dL — ABNORMAL HIGH (ref 60–99)
Lab: 19 mg/dL (ref 6–20)
Potassium: 5.3 mmol/L — ABNORMAL HIGH (ref 3.3–5.1)
Sodium: 137 mmol/L (ref 133–145)

## 2016-07-26 LAB — POCT GLUCOSE: Glucose POCT: 104 mg/dL — ABNORMAL HIGH (ref 60–99)

## 2016-07-26 SURGERY — LAMINECTOMY, SPINE, LUMBAR, WITH IN SITU FUSION
Anesthesia: General | Site: Back | Wound class: Clean

## 2016-07-26 MED ORDER — OXYCODONE HCL 5 MG PO TABS *I*
5.0000 mg | ORAL_TABLET | ORAL | Status: DC | PRN
Start: 2016-07-26 — End: 2016-07-29
  Administered 2016-07-29: 5 mg via ORAL
  Filled 2016-07-26 (×2): qty 1

## 2016-07-26 MED ORDER — CLINDAMYCIN PHOSPHATE IN D5W 900 MG/50ML IV SOLN *I*
INTRAVENOUS | Status: AC
Start: 2016-07-26 — End: 2016-07-26
  Filled 2016-07-26: qty 50

## 2016-07-26 MED ORDER — FENTANYL CITRATE 50 MCG/ML IJ SOLN *WRAPPED*
INTRAMUSCULAR | Status: DC | PRN
Start: 2016-07-26 — End: 2016-07-26
  Administered 2016-07-26 (×4): 50 ug via INTRAVENOUS
  Administered 2016-07-26: 100 ug via INTRAVENOUS

## 2016-07-26 MED ORDER — ONDANSETRON HCL 2 MG/ML IV SOLN *I*
INTRAMUSCULAR | Status: AC
Start: 2016-07-26 — End: 2016-07-26
  Filled 2016-07-26: qty 2

## 2016-07-26 MED ORDER — ONDANSETRON HCL 2 MG/ML IV SOLN *I*
4.0000 mg | Freq: Four times a day (QID) | INTRAMUSCULAR | Status: DC | PRN
Start: 2016-07-26 — End: 2016-07-29
  Administered 2016-07-27 – 2016-07-28 (×2): 4 mg via INTRAVENOUS
  Filled 2016-07-26 (×2): qty 2

## 2016-07-26 MED ORDER — SENNOSIDES 8.6 MG PO TABS *I*
2.0000 | ORAL_TABLET | Freq: Every day | ORAL | Status: DC
Start: 2016-07-26 — End: 2016-07-29
  Administered 2016-07-27 – 2016-07-29 (×3): 2 via ORAL
  Filled 2016-07-26 (×3): qty 2

## 2016-07-26 MED ORDER — MIDAZOLAM HCL 1 MG/ML IJ SOLN *I* WRAPPED
INTRAMUSCULAR | Status: DC | PRN
Start: 2016-07-26 — End: 2016-07-26
  Administered 2016-07-26: 2 mg via INTRAVENOUS

## 2016-07-26 MED ORDER — SODIUM CHLORIDE BACTERIOSTATIC 0.9 % IJ SOLN WRAPPED  *I*
INTRAMUSCULAR | Status: AC
Start: 2016-07-26 — End: 2016-07-26
  Filled 2016-07-26: qty 60

## 2016-07-26 MED ORDER — LACTATED RINGERS IV SOLN *I*
100.0000 mL/h | INTRAVENOUS | Status: DC
Start: 2016-07-26 — End: 2016-07-29
  Administered 2016-07-26: 100 mL/h via INTRAVENOUS

## 2016-07-26 MED ORDER — KETOROLAC TROMETHAMINE 30 MG/ML IJ SOLN *I*
INTRAMUSCULAR | Status: AC
Start: 2016-07-26 — End: 2016-07-26
  Filled 2016-07-26: qty 1

## 2016-07-26 MED ORDER — LIDOCAINE HCL 2 % IJ SOLN *I*
INTRAMUSCULAR | Status: DC | PRN
Start: 2016-07-26 — End: 2016-07-26
  Administered 2016-07-26: 60 mg via INTRAVENOUS

## 2016-07-26 MED ORDER — KETOROLAC TROMETHAMINE 30 MG/ML IJ SOLN *I*
15.0000 mg | Freq: Four times a day (QID) | INTRAMUSCULAR | Status: AC
Start: 2016-07-26 — End: 2016-07-26
  Administered 2016-07-26 (×2): 15 mg via INTRAVENOUS

## 2016-07-26 MED ORDER — BISACODYL 10 MG RE SUPP *I*
10.0000 mg | Freq: Every day | RECTAL | Status: DC | PRN
Start: 2016-07-26 — End: 2016-07-29
  Filled 2016-07-26: qty 1

## 2016-07-26 MED ORDER — THROMBIN (RECOMBINANT) 5000 UNIT EX SOLR *I* WRAPPED
INTRAMUSCULAR | Status: DC | PRN
Start: 2016-07-26 — End: 2016-07-26
  Administered 2016-07-26: 30 mL via TOPICAL

## 2016-07-26 MED ORDER — CLINDAMYCIN PHOSPHATE IN D5W 900 MG/50ML IV SOLN *I*
900.0000 mg | Freq: Once | INTRAVENOUS | Status: AC
Start: 2016-07-26 — End: 2016-07-26
  Administered 2016-07-26: 900 mg via INTRAVENOUS

## 2016-07-26 MED ORDER — SODIUM CHLORIDE 0.9 % IV SOLN WRAPPED *I*
20.0000 mL/h | Status: DC
Start: 2016-07-26 — End: 2016-07-26

## 2016-07-26 MED ORDER — OXYCODONE HCL 5 MG/5ML PO SOLN *I*
10.0000 mg | Freq: Once | ORAL | Status: AC | PRN
Start: 2016-07-26 — End: 2016-07-26
  Administered 2016-07-26: 10 mg via ORAL

## 2016-07-26 MED ORDER — ACETAMINOPHEN 500 MG PO TABS *I*
ORAL_TABLET | ORAL | Status: AC
Start: 2016-07-26 — End: 2016-07-26
  Administered 2016-07-26: 1000 mg via ORAL
  Filled 2016-07-26: qty 2

## 2016-07-26 MED ORDER — CLINDAMYCIN PHOSPHATE IN D5W 600 MG/50ML IV SOLN *I*
600.0000 mg | Freq: Three times a day (TID) | INTRAVENOUS | Status: DC
Start: 2016-07-26 — End: 2016-07-29
  Administered 2016-07-27 – 2016-07-29 (×7): 600 mg via INTRAVENOUS
  Filled 2016-07-26 (×10): qty 50

## 2016-07-26 MED ORDER — LIDOCAINE HCL 2 % (PF) IJ SOLN *I*
INTRAMUSCULAR | Status: AC
Start: 2016-07-26 — End: 2016-07-26
  Filled 2016-07-26: qty 5

## 2016-07-26 MED ORDER — SUGAMMADEX SODIUM 100 MG/1ML IV SOLN *WRAPPED*
INTRAVENOUS | Status: DC | PRN
Start: 2016-07-26 — End: 2016-07-26
  Administered 2016-07-26: 120 mg via INTRAVENOUS

## 2016-07-26 MED ORDER — ROCURONIUM BROMIDE 10 MG/ML IV SOLN *WRAPPED*
Status: AC
Start: 2016-07-26 — End: 2016-07-26
  Filled 2016-07-26: qty 5

## 2016-07-26 MED ORDER — PROMETHAZINE HCL 25 MG/ML IJ SOLN *I*
25.0000 mg | Freq: Four times a day (QID) | INTRAMUSCULAR | Status: DC | PRN
Start: 2016-07-26 — End: 2016-07-29

## 2016-07-26 MED ORDER — VANCOMYCIN HCL 1000 MG IV SOLR WRAPPED *I*
INTRAVENOUS | Status: DC | PRN
Start: 2016-07-26 — End: 2016-07-26
  Administered 2016-07-26 (×2): 1 g via TOPICAL

## 2016-07-26 MED ORDER — NALOXONE HCL 0.4 MG/ML IJ SOLN *WRAPPED*
0.1000 mg | Status: DC | PRN
Start: 2016-07-26 — End: 2016-07-29

## 2016-07-26 MED ORDER — CLINDAMYCIN PHOSPHATE IN D5W 600 MG/50ML IV SOLN *I*
INTRAVENOUS | Status: AC
Start: 2016-07-26 — End: 2016-07-26
  Administered 2016-07-26: 600 mg via INTRAVENOUS
  Filled 2016-07-26: qty 50

## 2016-07-26 MED ORDER — FENTANYL CITRATE 50 MCG/ML IJ SOLN *WRAPPED*
INTRAMUSCULAR | Status: AC
Start: 2016-07-26 — End: 2016-07-26
  Filled 2016-07-26: qty 4

## 2016-07-26 MED ORDER — POVIDONE-IODINE 10 % EX SWAB *I*
Freq: Once | CUTANEOUS | Status: AC
Start: 2016-07-26 — End: 2016-07-26

## 2016-07-26 MED ORDER — PHENYLEPHRINE 100 MCG/ML IN NS 10 ML *WRAPPED*
INTRAMUSCULAR | Status: DC | PRN
Start: 2016-07-26 — End: 2016-07-26
  Administered 2016-07-26 (×6): 100 ug via INTRAVENOUS

## 2016-07-26 MED ORDER — POLYETHYLENE GLYCOL 3350 PO PACK 17 GM *I*
17.0000 g | PACK | Freq: Every day | ORAL | Status: DC
Start: 2016-07-26 — End: 2016-07-29
  Administered 2016-07-27 – 2016-07-29 (×3): 17 g via ORAL
  Filled 2016-07-26 (×3): qty 17

## 2016-07-26 MED ORDER — PROMETHAZINE HCL 25 MG/ML IJ SOLN *I*
6.2500 mg | Freq: Once | INTRAMUSCULAR | Status: DC | PRN
Start: 2016-07-26 — End: 2016-07-26

## 2016-07-26 MED ORDER — ONDANSETRON HCL 2 MG/ML IV SOLN *I*
INTRAMUSCULAR | Status: DC | PRN
Start: 2016-07-26 — End: 2016-07-26
  Administered 2016-07-26: 4 mg via INTRAMUSCULAR

## 2016-07-26 MED ORDER — DOCUSATE SODIUM 100 MG PO CAPS *I*
200.0000 mg | ORAL_CAPSULE | Freq: Every evening | ORAL | Status: DC
Start: 2016-07-26 — End: 2016-07-29
  Administered 2016-07-28: 200 mg via ORAL
  Filled 2016-07-26: qty 2

## 2016-07-26 MED ORDER — HYDROMORPHONE PCA 1 MG/ML *WRAPPED*
INTRAMUSCULAR | Status: AC
Start: 2016-07-26 — End: 2016-07-26
  Filled 2016-07-26: qty 25

## 2016-07-26 MED ORDER — HYDROMORPHONE PCA 1 MG/ML *WRAPPED*
INTRAMUSCULAR | Status: DC
Start: 2016-07-26 — End: 2016-07-27

## 2016-07-26 MED ORDER — FENTANYL CITRATE 50 MCG/ML IJ SOLN *WRAPPED*
INTRAMUSCULAR | Status: AC
Start: 2016-07-26 — End: 2016-07-26
  Filled 2016-07-26: qty 2

## 2016-07-26 MED ORDER — THROMBIN (RECOMBINANT) 5000 UNIT EX SOLR *I* WRAPPED
CUTANEOUS | Status: AC
Start: 2016-07-26 — End: 2016-07-26
  Filled 2016-07-26: qty 4

## 2016-07-26 MED ORDER — OXYCODONE HCL 5 MG/5ML PO SOLN *I*
5.0000 mg | Freq: Once | ORAL | Status: AC | PRN
Start: 2016-07-26 — End: 2016-07-26

## 2016-07-26 MED ORDER — MAGNESIUM HYDROXIDE 400 MG/5ML PO SUSP *I*
30.0000 mL | Freq: Every day | ORAL | Status: DC | PRN
Start: 2016-07-26 — End: 2016-07-29
  Filled 2016-07-26: qty 30

## 2016-07-26 MED ORDER — MIDAZOLAM HCL 1 MG/ML IJ SOLN *I* WRAPPED
INTRAMUSCULAR | Status: AC
Start: 2016-07-26 — End: 2016-07-26
  Filled 2016-07-26: qty 2

## 2016-07-26 MED ORDER — PROPOFOL 10 MG/ML IV EMUL (INTERMITTENT DOSING) WRAPPED *I*
INTRAVENOUS | Status: DC | PRN
Start: 2016-07-26 — End: 2016-07-26
  Administered 2016-07-26: 120 mg via INTRAVENOUS

## 2016-07-26 MED ORDER — LIDOCAINE HCL 1 % IJ SOLN *I*
0.1000 mL | INTRAMUSCULAR | Status: DC | PRN
Start: 2016-07-26 — End: 2016-07-26

## 2016-07-26 MED ORDER — OXYCODONE HCL 5 MG/5ML PO SOLN *I*
ORAL | Status: AC
Start: 2016-07-26 — End: 2016-07-26
  Filled 2016-07-26: qty 10

## 2016-07-26 MED ORDER — OXYCODONE HCL 5 MG PO TABS *I*
5.0000 mg | ORAL_TABLET | ORAL | Status: DC | PRN
Start: 2016-07-26 — End: 2016-07-29
  Administered 2016-07-27 – 2016-07-29 (×5): 5 mg via ORAL
  Filled 2016-07-26 (×4): qty 1

## 2016-07-26 MED ORDER — DEXAMETHASONE SODIUM PHOSPHATE 4 MG/ML INJ SOLN *WRAPPED*
INTRAMUSCULAR | Status: AC
Start: 2016-07-26 — End: 2016-07-26
  Filled 2016-07-26: qty 1

## 2016-07-26 MED ORDER — NEOMYCIN SULFATE IRRIGATION 0.2% *I*
Status: DC | PRN
Start: 2016-07-26 — End: 2016-07-26
  Administered 2016-07-26: 1000 mL

## 2016-07-26 MED ORDER — MEPERIDINE HCL 25 MG/ML IJ SOLN *I*
12.5000 mg | INTRAMUSCULAR | Status: DC | PRN
Start: 2016-07-26 — End: 2016-07-26

## 2016-07-26 MED ORDER — LACTATED RINGERS IV SOLN *I*
50.0000 mL/h | INTRAVENOUS | Status: DC
Start: 2016-07-26 — End: 2016-07-26

## 2016-07-26 MED ORDER — LACTATED RINGERS IV SOLN *I*
20.0000 mL/h | INTRAVENOUS | Status: DC
Start: 2016-07-26 — End: 2016-07-26
  Administered 2016-07-26: 20 mL/h via INTRAVENOUS

## 2016-07-26 MED ORDER — SUGAMMADEX SODIUM 100 MG/1ML IV SOLN *WRAPPED*
INTRAVENOUS | Status: AC
Start: 2016-07-26 — End: 2016-07-26
  Filled 2016-07-26: qty 2

## 2016-07-26 MED ORDER — THROMBIN (RECOMBINANT) 5000 UNIT EX SOLR *I* WRAPPED
INTRAMUSCULAR | Status: DC | PRN
Start: 2016-07-26 — End: 2016-07-26

## 2016-07-26 MED ORDER — PROPOFOL 10 MG/ML IV EMUL (INTERMITTENT DOSING) WRAPPED *I*
INTRAVENOUS | Status: AC
Start: 2016-07-26 — End: 2016-07-26
  Filled 2016-07-26: qty 20

## 2016-07-26 MED ORDER — ROCURONIUM BROMIDE 10 MG/ML IV SOLN *WRAPPED*
Status: DC | PRN
Start: 2016-07-26 — End: 2016-07-26
  Administered 2016-07-26: 40 mg via INTRAVENOUS
  Administered 2016-07-26 (×2): 10 mg via INTRAVENOUS
  Administered 2016-07-26: 20 mg via INTRAVENOUS

## 2016-07-26 MED ORDER — DEXAMETHASONE SODIUM PHOSPHATE 4 MG/ML INJ SOLN *WRAPPED*
INTRAMUSCULAR | Status: DC | PRN
Start: 2016-07-26 — End: 2016-07-26
  Administered 2016-07-26: 4 mg via INTRAVENOUS

## 2016-07-26 MED ORDER — CARBIDOPA-LEVODOPA 25-100 MG PO TABS *I*
1.0000 | ORAL_TABLET | Freq: Three times a day (TID) | ORAL | Status: DC
Start: 2016-07-26 — End: 2016-07-29
  Administered 2016-07-26 – 2016-07-29 (×10): 1 via ORAL
  Filled 2016-07-26 (×13): qty 1

## 2016-07-26 MED ORDER — ACETAMINOPHEN 500 MG PO TABS *I*
1000.0000 mg | ORAL_TABLET | Freq: Three times a day (TID) | ORAL | Status: DC
Start: 2016-07-26 — End: 2016-07-29
  Administered 2016-07-27 – 2016-07-29 (×8): 1000 mg via ORAL
  Filled 2016-07-26 (×8): qty 2

## 2016-07-26 MED ORDER — HALOPERIDOL LACTATE 5 MG/ML IJ SOLN *I*
0.5000 mg | Freq: Once | INTRAMUSCULAR | Status: DC | PRN
Start: 2016-07-26 — End: 2016-07-26

## 2016-07-26 SURGICAL SUPPLY — 35 items
BIT FLUTED BALL 4MM (Supply) ×1
BIT FLUTED MATCHSTICK 3MM (Supply) ×2 IMPLANT
BLADE THE MILL BONE MED 5MM DISP (Other) ×2 IMPLANT
BUR SUR 4MM BALL FLUT STD LEN FOR LNG LNG-S QD14 QD14-S LNG-G1 QD14-G1 QD14-S-G1 ATTCH (Supply) ×1 IMPLANT
CAP SPNL TAN LOK 1 STP FOR 5.5MM ROD MTRX (Implant) ×12 IMPLANT
CLOSURE STERI-STRIP REINF .5 X 4IN LF (Dressing) IMPLANT
DRAPE C-ARM 42 IN X 120 IN (Drape) ×2 IMPLANT
DRAPE SHEET 40X70 MED (Drape) ×4 IMPLANT
FILTER NEPTUNE 4PORT MANIFOLD (Supply) ×2 IMPLANT
GLOVE BIOGEL PI MICRO SZ 8 (Glove) ×20 IMPLANT
GLOVE BIOGEL PI MICRO SZ 8.5 (Glove) ×2 IMPLANT
GLOVE BIOGEL PI ORTHOPRO SZ 8 LF (Glove) ×2 IMPLANT
HANDLE LITE EZ RIGID NL (Supply) ×4 IMPLANT
HEAD MATRIX TOP LOADING POLYAXIAL TI (Implant) ×12 IMPLANT
KIT CARE PATIENT ORHTO NL (Supply) ×2 IMPLANT
KIT DRN WOUND RND 3SPRING RES 400CC 10FR (Supply) IMPLANT
KIT RESERVOIR 3SPRING 400CC 19FR (Supply) ×2 IMPLANT
PACK CUSTOM ORTHO SPINE CDS (Pack) ×2 IMPLANT
PACK TOWEL LIGHT BLUE STERILE (Supply) ×2 IMPLANT
PILLOW CUSHION INSERT PRONE VW DE-TEC (Supply) ×2 IMPLANT
ROD MATRIX HARD CRV TI 5.5 X 50MM (Rod) ×4 IMPLANT
SCREW MATRIX BONE TI 6 X 40MM (Screw) ×8 IMPLANT
SCREW MATRIX BONE TI 6 X 50MM (Screw) ×2 IMPLANT
SCREW SPNL L40MM DIA7MM POST PEDCL TI ALLY FT FOR 5.5MM ROD DEGENERATIVE DEFORMITY SYS (Screw) ×2 IMPLANT
SLEEVE COMP KNEE HI MED (Supply) ×2 IMPLANT
SPONGE HEMOSTATIC GELATIN PWD SURGIFOAM 1GM (Supply) ×2 IMPLANT
SPONGE K DISSECTOR (Sponge) ×1
SPONGE LAP 12X12 STERILE 5 PACK (Sponge) ×2 IMPLANT
SPONGE SUR W0.25XL9/16IN WHT COT RND KTNR DISECT RADPQ DISP (Sponge) ×1 IMPLANT
SPONGE X-RAY 4 X 8 STERILE NL (Sponge) ×4 IMPLANT
STAPLER SKIN 35W NL (Supply) ×1
STAPLER SKIN WIDE STPL LEG L3.9MM WIRE DIA0.58MM FIX HD PROX (Supply) ×1 IMPLANT
SUTR MONOCRYL PLUS 3-0PS2 27IN UNDY (Suture) IMPLANT
SUTR VICRYL ANTIB 1 CTX 36 VIOLET (Suture) ×16 IMPLANT
SUTR VICRYL ANTIB BRD 2-0 CP-2 18 UNDYED (Suture) ×4 IMPLANT

## 2016-07-26 NOTE — H&P (Signed)
Day of Surgery/Procedure Update:  History  History reviewed and no change    Physical  Physical exam updated and no change

## 2016-07-26 NOTE — Anesthesia Procedure Notes (Signed)
---------------------------------------------------------------------------------------------------------------------------------------    AIRWAY   GENERAL INFORMATION AND STAFF    Patient location during procedure: OR       Date of Procedure: 07/26/2016 8:42 AM  CONDITION PRIOR TO MANIPULATION     Current Airway/Neck Condition:  Normal        For more airway physical exam details, see Anesthesia PreOp Evaluation  AIRWAY METHOD     Preoxygenated: yes      Induction: IV    Mask Difficulty Assessment:  1 - vent by mask      Mask NMB: 1 - vent by mask      Technique Used for Successful ETT Placement:  Direct laryngoscopy    Devices/Methods Used in Placement:  Intubating stylet    Blade Type:  Miller    Laryngoscope Blade/Video laryngoscope Blade Size:  2    Cormack-Lehane Classification:  Grade I - full view of glottis    Placement Verified by: capnometry, auscultation and equal breath sounds      Number of Attempts at Approach:  1    Number of Other Approaches Attempted:  0  FINAL AIRWAY DETAILS    Final Airway Type:  Endotracheal airway    Adjunct Airway: soft bite block    Final Endotracheal Airway:  ETT      Cuffed: cuffed    Insertion Site:  Oral    ETT Size (mm):  7.0    Cuff Volume (mL):  3    Distance inserted from Lips (cm):  22  ----------------------------------------------------------------------------------------------------------------------------------------

## 2016-07-26 NOTE — OR Nursing (Signed)
Met and identified patient in prean. Patient verified surgical procedure, site and consent as noted in chart. Patient states pain in bilateral legs. Final positioning reviewed by Dr. Molinari and team prior to procedure. [email or web address],rn

## 2016-07-26 NOTE — Anesthesia Case Conclusion (Signed)
CASE CONCLUSION  Emergence  Actions:  Suctioned and extubated  Criteria Used for Airway Removal:  Adequate Tv & RR, acceptable O2 saturation, following commands, strong hand grip, 5 sec head lift and sustained tetany  Assessment:  Routine  Transport  Directly to: PACU  Airway:  Nasal cannula  Oxygen Delivery:  4 lpm  Position:  Recumbent  Patient Condition on Handoff  Level of Consciousness:  Mildly sedated  Patient Condition:  Stable  Handoff Report to:  RN

## 2016-07-26 NOTE — Progress Notes (Signed)
[facility]  Reviewed PMH, OR Case, LDA's and recovery course. All questions answered and receiving RN indicated understanding. All VSS prior to transport, using PCA appropriately. Tranported via bed without issue.  Ferman Hammingarlee N Jairus Tonne, RN

## 2016-07-26 NOTE — Op Note (Signed)
Linda Pita:   Arroyo, Linda Arroyo  MR #:  [id number]   CSN:  [id number] DOB:  [birth year]    AGE:  61     SURGEON:  Nechama Guardobert W Rickelle Sylvestre, MD  CO-SURGEON:    ASSISTANMickle Asper:  Erica Maskey.  SURGERY DATE:  07/26/2016    PREOPERATIVE DIAGNOSIS:    1. Grade 2 L4-5 degenerative spondylolisthesis with severe spinal stenosis.  2. L5-S1 severe disk degeneration with spinal stenosis.    POSTOPERATIVE DIAGNOSIS:    1. Grade 2 L4-5 degenerative spondylolisthesis with severe spinal stenosis.  2. L5-S1 severe disk degeneration with spinal stenosis.    OPERATIVE PROCEDURE:  L4-5 and L5-S1 bilateral lumbar decompression, partial laminectomy, partial facetectomy, bilateral foraminotomy, and L4 through S1 instrumented posterolateral fusion using Synthes titanium matrix polyaxial pedicle screws and rods and right posterior iliac crest bone graft.    ANESTHESIA:  General.    COMPLICATIONS:  None.    ESTIMATED BLOOD LOSS:  300 cc.    DRAINS:  One large Hemovac.    INDICATIONS FOR PROCEDURE:  The patient is a 61 year old female with severe back and leg pain refractory to conservative measures.  Please see office notes for details of presentation.  The patient was counseled as to risks and benefits of surgery.  Please see consent form.    DESCRIPTION OF PROCEDURE:  The patient was taken to the operating room, where general anesthetic was administered without complication.  The patient was placed prone on the OSI table.  The back was prepped and draped in a sterile fashion.  Using standard posterior approach to the spine, the spine was exposed using Cobb curettes and electrocautery out to the facet joints at L4, L5, and S1, and out to the transverse processes at L4, L5, and sacral ala.  Next, pedicle screws were placed bilaterally in L4, bilaterally in L5, and bilaterally in S1 under image intensification using the freehand technique.  Each screw was checked in the AP and lateral planes, and noted to be appropriately placed.  A surgical pause was conducted  upon placing the first screw in level.  Next, interspinous ring at L4-5 and L5-S1 was removed with rongeur.  The inferior half of the L4 and L5 laminae was removed using a bur.  Complete L5 laminectomy was performed using #4 Kerrison.  The lateral recess was widened using a bur and foraminotomies were performed at both levels using #1 Kerrison.  At the completion of the decompressive procedure, bilateral L4, L5, and S1 nerve roots were noted to be free of compression.  The wound was thoroughly irrigated.  The transverse processes at L4, L5, and S1 were decorticated using a bur.  Copious amounts of right posterior iliac bone grafts were obtained through a separate subfascial incision, and placed posterolateral of L4, L5, and S1.  Exposed transverse processes and sacral ala left for bone graft fusion.  The wound was thoroughly irrigated.  Rods were attached to the screws and securely tightened.  AP and lateral x-ray revealed appropriate placement of screws, rods, and bone graft.  Hemostasis was achieved with electrocautery.  A large Hemovac drain was prophylactically placed.  2 g of powdered vancomycin was placed prophylactically in the wound.  The fascia was closed tight using simple Vicryl sutures.  Subcutaneous tissue was closed with simple Vicryl sutures.  Skin was closed with staples. The patient tolerated the procedure well.             ______________________________  Nechama Guardobert W Orvan Papadakis, MD    RWM/MODL  DD:  07/26/2016 10:51:22  DT:  07/26/2016 11:29:49  Job #:  [id number]    cc:

## 2016-07-26 NOTE — Progress Notes (Signed)
05-3398 Admission Note    Linda Arroyo Monroy arrived on 05-3398 [id number].  Sheeted from stretcher to bed.     Vital signs stable.  CMS checks WNL.  Pain currently 4/10.    Patient oriented to call bell, room, unit, visiting hours, IPC, and incentive spirometer.     Linda Arroyo Arnaud acknowledged understanding of current treatment plan.    Linda Arroyo Mutz is currently resting with call bell in reach.    Four eyed skin assessment completed with NATINA GREER, pct   .       Important patient belongings at bedside upon admission:   Patient made aware of cashiers office for valuables.    See doc flow sheets and MAR for full assessment and interventions.    Signed by: Hoyt Kochyan R Dashanti Burr, RN as of 07/26/2016 at 6:47 PM

## 2016-07-26 NOTE — Anesthesia Preprocedure Evaluation (Addendum)
Anesthesia Pre-operative History and Physical for Linda Arroyo    Highlighted Issues for this Procedure:  61 y.o. female with Lumbar stenosis with neurogenic claudication (M48.062)  Spondylolisthesis at L4-L5 level (M43.16) presenting for Procedure(s):  L4-L5 decompression, L4-S1  instrumented fusion and Right ICBG by Surgeon(s):  Mardee PostinMolinari, Robert, MD scheduled for 195 minutes.        .  Anesthesia Evaluation Information Source: records, patient, family     ANESTHESIA HISTORY  Pertinent(-):  No History of anesthetic complications or Family hx of anesthetic complications    GENERAL  Pertinent (-):  No obesity or substance abuse    HEENT  Pertinent (-):  No TMJD or neck pain PULMONARY  Pertinent(-):  No smoking, asthma, shortness of breath, recent URI, cough/congestion, snoring, sleep apnea or COPD    CARDIOVASCULAR  Good(4+METs) Exercise Tolerance  Pertinent(-):  No hypertension or angina    GI/HEPATIC/RENAL  Last PO Intake: >8hr before procedure and >2hr before procedure (clears)    + Alcohol use          social  Pertinent(-):  No GERD, liver  issues or renal issues NEURO/PSYCH    + Chronic pain (tramadol)          lower back    + Neuromuscular disease (spinal stenosis, low back pain and bilat thighs)          Parkinsons  Pertinent(-):  No headaches, syncope, seizures, cerebrovascular event or peripheral nerve issue    ENDO/OTHER  Pertinent(-):  No diabetes mellitus, thyroid disease, steroid use    HEMATOLOGIC  Pertinent(-):  No coagulopathy, anticoagulants/antiplatelet medications or arthritis       Physical Exam    Airway            Mouth opening: normal            Mallampati: III            TM distance (fb): >3 FB            Neck ROM: full     Cardiovascular  Normal Exam           Rhythm: regular           Rate: normal  No JVD or peripheral edema        General Survey    Normal Exam   Pulmonary   Normal Exam    breath sounds clear to auscultation    No cough, rhonchi, decreased breath sounds, wheezes,  rales    Mental Status   Normal Exam    oriented to person, place and time         ________________________________________________________________________  PLAN  ASA Score  3  Anesthetic Plan general     Induction (routine IV) General Anesthesia/Sedation Maintenance Plan (inhaled agents);  Airway Manipulation (direct laryngoscopy); Airway (cuffed ETT); Line ( use current access); Monitoring (standard ASA); Positioning (prone); PONV Plan (ondansetron and dexamethasone); Pain (per surgical team); PostOp (PACU)    Informed Consent     Risks:          Risks discussed were commensurate with the plan listed above with the following specific points: N/V, aspiration, sore throat and hypotension , damage to:(eyes, teeth), awareness, unexpected serious injury, allergic Rx, death    Anesthetic Consent:         Anesthetic plan (and risks as noted above) were discussed with patient and spouse    Blood products Consent:        Use of blood products discussed with:  spouse and patient and they consented    Plan also discussed with team members including:       CRNA and surgeon    Attending Attestation:  As the primary attending anesthesiologist, I attest that the patient or proxy understands and accepts the risks and benefits of the anesthesia plan. I also attest that I have personally performed a pre-anesthetic examination and evaluation, and prescribed the anesthetic plan for this particular location within 48 hours prior to the anesthetic as documented. Valera Castle, MD 7:04 AM

## 2016-07-26 NOTE — INTERIM OP NOTE (Signed)
Interim Op Note (Surgical Log ID: [id number])       Date of Surgery: 07/26/2016       Surgeons: Surgeon(s) and Role:     * Mardee PostinMolinari, Robert, MD - Primary     * Sharman CrateEmanski, Norberto SorensonEric Lee, MD - Fellow       Pre-op Diagnosis: Pre-Op Diagnosis Codes:     * Lumbar stenosis with neurogenic claudication [M48.062]     * Spondylolisthesis at L4-L5 level [M43.16]       Post-op Diagnosis: Post-Op Diagnosis Codes:     * Lumbar stenosis with neurogenic claudication [M48.062]     * Spondylolisthesis at L4-L5 level [M43.16]       Procedure(s) Performed: Procedure:    L4-L5 decompression, L4-S1  instrumented fusion and Right ICBG  CPT(R) Code:  8469622612 - PR ARTHRODESIS POSTERIOR/POSTEROLATERAL LUMBAR         Additional CPT Codes: 2952863047: Pr Laminec/Facetect/Foramin,Lumbar 1 Seg;  4132422842: Pr Posterior Segmental Instrumentation 3-6 Vrt Seg;  20937: Pr Autograft Spine Surgery Morselized Sep Incision;         Anesthesia Type: General        Fluid Totals: I/O this shift:  07/09 0700 - 07/09 1459  In: 2300 (37.6 mL/kg) [I.V.:2300]  Out: 680 (11.1 mL/kg) [Urine:380; Blood:300]  Net: 1620  Weight: 61.1 kg        Estimated Blood Loss: Blood Loss: 300 mL       Specimens to Pathology:  * No specimens in log *       Temporary Implants:        Packing:                 Patient Condition: good       Findings (Including unexpected complications): Lumbar stenosis     WBAT  AAT  Drain  Periop Abx  Dilaudid PCA  OK for toradol  PT/OT      Signed:  Brion AlimentEric Lee Abriella Filkins, MD  on 07/26/2016 at 11:05 AM

## 2016-07-26 NOTE — Anesthesia Postprocedure Evaluation (Signed)
Anesthesia Post-Op Note    Patient: Linda Arroyo    Procedure(s) Performed:  Procedure Summary  Date:  07/26/2016 Anesthesia Start: 07/26/2016  7:44 AM Anesthesia Stop: 07/26/2016 11:35 AM Room / Location:  [id number] / [facility] MAIN OR   Procedure(s):  L4-L5 decompression, L4-S1  instrumented fusion and Right ICBG Diagnosis:  Lumbar stenosis with neurogenic claudication [M48.062]  Spondylolisthesis at L4-L5 level [M43.16] Surgeon(s):  Mardee PostinMolinari, Robert, MD  Brion AlimentEmanski, Eric Lee, MD Attending Anesthesiologist:  Valera CastleNEUMANN, Kayson Tasker J, MD         Recovery Vitals  BP: 100/71 (07/26/2016 12:45 PM)  Heart Rate: 87 (07/26/2016 12:45 PM)  Heart Rate (via Pulse Ox): 90 (07/26/2016 12:45 PM)  Resp: 9 (07/26/2016 12:45 PM)  Temp: 36.4 C (97.5 F) (07/26/2016 11:20 AM)  SpO2: 100 % (07/26/2016 12:45 PM)  O2 Flow Rate: 2 L/min (07/26/2016 12:45 PM)   0-10 Scale: 6 (07/26/2016 12:30 PM)  Anesthesia type:  General  Complications Noted During Procedure or in PACU:  None   Comment:    Patient Location:  PACU  Level of Consciousness:    Recovered to baseline, awake and alert  Patient Participation:     Able to participate  Temperature Status:    Normothermic  Oxygen Saturation:    Within patient's normal range  Cardiac Status:   Within patient's normal range  Fluid Status:    Stable  Airway Patency:     Yes  Pulmonary Status:    Baseline  Pain Management:    Adequate analgesia and satisfactory to patient  Nausea and Vomiting:  None  Comments:    Doing well. No complaints.  Post Op Assessment:    Tolerated procedure well   Attending Attestation:  All indicated post anesthesia care provided     -

## 2016-07-26 NOTE — Plan of Care (Signed)
Cognitive function    • Cognitive function will be maintained or return to baseline Progressing towards goal        Impaired Balance    • Patient will maintain balance to allow for safe mobility and Progressing towards goal        Impaired Mobility (musc)    • Patient's mobility is maintained or improved (musc) Progressing towards goal        Mobility    • Patient's functional status is maintained or improved Progressing towards goal        Nutrition    • Patient's nutritional status is maintained or improved Progressing towards goal        Pain/Comfort    • Patient's pain or discomfort is manageable Progressing towards goal        Post-Operative Bladder Elimination    • Patient is able to empty bladder or return to baseline Progressing towards goal        Post-Operative Bowel Elimination    • Elimination pattern is normal or improving Progressing towards goal        Post-Operative Complications    • Prevent post-operative complications Progressing towards goal    • Patient will remain free from symptoms of infection-post op Progressing towards goal        Post-Operative Hemodynamic Stability    • Maintain Hemodynamic Stability Progressing towards goal        Potential for impaired Circulation, Motor, or Sensation    • Patient's CMS status is maintained or improved from baseline Progressing towards goal        Psychosocial    • Demonstrates ability to cope with illness Progressing towards goal        Safety    • Patient will remain free of falls Progressing towards goal    • Prevent any intentional injury Progressing towards goal

## 2016-07-26 NOTE — Progress Notes (Signed)
Patient:Linda Arroyo  MRN: [id number]  DOA: 07/26/2016    Ortho Spine Progress Note for 07/26/2016    Events:   No acute events since surgery. Denies CP/SOB/N/V. Pain controlled with PCA. A & O x 3.  Reports no pain down her legs.      Vitals:  Vitals:    07/26/16 1215 07/26/16 1230 07/26/16 1245 07/26/16 1345   BP: 117/78 113/77 100/71    BP Location:       Pulse: 91 90 87    Resp: (!) 8 (!) 9 (!) 9    Temp:       TempSrc:       SpO2: 100% 100% 100% 95%   Weight:       Height:           Intake/output:  I/O this shift:  07/09 0700 - 07/09 1459  In: 2300 (37.6 mL/kg) [I.V.:2300]  Out: 955 (15.6 mL/kg) [Urine:530; Drains:125; Blood:300]  Net: 1345  Weight: 61.1 kg       Labs:  No results for input(s): WBC, HCT, CREAT, INR, PLT, K, GLU in the last 168 hours.      Physical Exam:  NAD, AAOx3  Lumbar Dressing c/d/i, Drain with SS output    RLE  5 hip flexion, 5 knee extension, 5 ankle dorsiflexion, 5 great toe extension, 5 great toe flexion  Bilateral SILT L2-S1   Foot WWP    LLE  5 hip flexion, 5 knee extension, 5 ankle dorsiflexion, 5 great toe extension, 5 great toe flexion  Bilateral SILT L2-S1   Foot WWP    Assessment and Plan:  61 y.o. female admitted on 07/26/2016 now s/pL4-L5 decompression, L4-S1  instrumented fusion and Right ICBG on 07/26/16 with Dr. Ronney AstersMolinari.    1. Post-op abx x 24hr  2. PCA for pain control  3. Diet: regular  4. IV fluids  5. Foley out POD 1  6. Drains until output low  7. PT/OT, activity as tolerated  8. DVT ppx  9. Disposition planning    Candelaria Stagersimothy A Avian Konigsberg, PA  Orthopaedic Spine Surgery  2:37 PM 07/26/2016

## 2016-07-27 ENCOUNTER — Encounter: Payer: Self-pay | Admitting: Orthopedic Surgery

## 2016-07-27 LAB — BASIC METABOLIC PANEL
Anion Gap: 11 (ref 7–16)
CO2: 26 mmol/L (ref 20–28)
Calcium: 8.2 mg/dL — ABNORMAL LOW (ref 8.6–10.2)
Chloride: 105 mmol/L (ref 96–108)
Creatinine: 0.67 mg/dL (ref 0.51–0.95)
GFR,Black: 109 *
GFR,Caucasian: 94 *
Glucose: 121 mg/dL — ABNORMAL HIGH (ref 60–99)
Lab: 12 mg/dL (ref 6–20)
Potassium: 4.4 mmol/L (ref 3.3–5.1)
Sodium: 142 mmol/L (ref 133–145)

## 2016-07-27 LAB — CBC AND DIFFERENTIAL
Baso # K/uL: 0 10*3/uL (ref 0.0–0.1)
Basophil %: 0.3 %
Eos # K/uL: 0.1 10*3/uL (ref 0.0–0.4)
Eosinophil %: 0.7 %
Hematocrit: 23 % — ABNORMAL LOW (ref 34–45)
Hemoglobin: 7.7 g/dL — ABNORMAL LOW (ref 11.2–15.7)
IMM Granulocytes #: 0 10*3/uL (ref 0.0–0.1)
IMM Granulocytes: 0.3 %
Lymph # K/uL: 1.2 10*3/uL (ref 1.2–3.7)
Lymphocyte %: 15.5 %
MCH: 32 pg/cell (ref 26–32)
MCHC: 34 g/dL (ref 32–36)
MCV: 92 fL (ref 79–95)
Mono # K/uL: 0.7 10*3/uL (ref 0.2–0.9)
Monocyte %: 8.7 %
Neut # K/uL: 5.6 10*3/uL (ref 1.6–6.1)
Nucl RBC # K/uL: 0 10*3/uL (ref 0.0–0.0)
Nucl RBC %: 0 /100 WBC (ref 0.0–0.2)
Platelets: 173 10*3/uL (ref 160–370)
RBC: 2.4 MIL/uL — ABNORMAL LOW (ref 3.9–5.2)
RDW: 12 % (ref 11.7–14.4)
Seg Neut %: 74.5 %
WBC: 7.6 10*3/uL (ref 4.0–10.0)

## 2016-07-27 MED ORDER — HYDROMORPHONE HCL 2 MG/ML IJ SOLN *WRAPPED*
0.5000 mg | INTRAMUSCULAR | Status: DC | PRN
Start: 2016-07-27 — End: 2016-07-29

## 2016-07-27 MED ORDER — SODIUM CHLORIDE 0.9 % IV BOLUS *I*
500.0000 mL | Freq: Once | Status: AC
Start: 2016-07-27 — End: 2016-07-27
  Administered 2016-07-27: 500 mL via INTRAVENOUS

## 2016-07-27 MED ORDER — SODIUM CHLORIDE 0.9 % IV BOLUS *I*
1000.0000 mL | Freq: Once | Status: AC
Start: 2016-07-27 — End: 2016-07-27
  Administered 2016-07-27: 1000 mL via INTRAVENOUS

## 2016-07-27 NOTE — Plan of Care (Signed)
Problem: Impaired ADL  Goal: Increase ADL Independence  LOWER BODY DRESSING - Patient will complete lower body dressing (except socks/shoes) with no device and modified independence on chair - Time Frame: 7-10 days  TOILETING - Patient will complete toileting with no device and modified independence on standard toilet - Time Frame: 7-10 days  ADL MOBILITY - Patient will ambulate to commode with no assist (Independent), Adaptive Device: rolling walker - Time Frame: 7-10 days

## 2016-07-27 NOTE — Progress Notes (Signed)
Physical Therapy Treatment Note:       07/27/16 1500   Visit Number   Visit Number [facility] / Treatment Day (HH) 2  (BID session)   Precautions/Observations   Precautions used Yes   LDA Observation Drain   Fall Precautions General falls precautions   Pain Assessment   *Is the patient currently in pain? Yes   Pain (Before,During, After) Therapy Before;During;After   0-10 Scale (did not quantify)   Pain Location Back   Pain Orientation Lower   Pain Intervention(s) Repositioned;Refer to nursing for pain management   Additional comments Pt states that she still has some nausea, but much improved from earlier session.    Vision    Current Vision Wears Software engineercorrective lenses   Cognition   Additional Comments Alert, follows commands without difficulty   Bed Mobility   Bed mobility Not tested   Additional comments Pt received ambulating in hallway with nurse, ended session in recliner   Transfers   Transfers Tested   Stand to sit Stand by assistance;1 person assist   Transfer Assistive Device rolling walker   Additional comments Verbal cues for safe technique and proper hand placement.    Mobility   Mobility Tested   Gait Pattern Decreased cadence   Ambulation Assist Contact guard;1 person assist   Ambulation Distance (Feet) 200   Ambulation Assistive Device rolling walker   Additional comments Gait is characterized by slow gait speed, mild trunk flexion throughout, decreased foot clearance bilaterally, and mild path deviations to the R. No complaints of lightheadedness or dizziness during ambulation.    Therapeutic Exercises   Additional comments Encouraged pt to ambulate with assistance 3x per day to her tolerance.    Balance   Balance Tested   Sitting - Static Independent    Sitting - Dynamic Independent   Standing - Static Contact guard;Supported   Standing - Teaching laboratory technicianDynamic Contact guard;Supported   PT AM-PAC Mobility   Turning over in bed? 1   Sitting down on and standing up from a chair with arms? 1   Moving from lying on back to  sitting on the side of the bed? 1   Moving to and from a bed to a chair? 3   Need to walk in hospital room? 3   Climbing 3 - 5 steps with a railing? 1   Total Raw Score 10   Standardized Score 32.29   CMS 1-100% Score 77   Assessment   Brief Assessment Appropriate for skilled therapy   Plan/Recommendation   Treatment Interventions Restorative PT   PT Frequency 3-5x/wk   Hospital Stay Recommendations Ambulate daily with nursing assist;Out of bed with nursing assist   Discharge Recommendations Anticipate return to prior living arrangement;Intermittent supervision/assist;Home PT   PT Discharge Equipment Recommended To be determined   Assessment/Recommendations Reviewed With: Nursing;Patient   Next Visit Progress transfers and ambulation. Assess stairs as able.    Time Calculation   PT Timed Codes 25   PT Untimed Codes 0   PT Unbilled Time 0   PT Total Treatment 25   Plan and Onset date   Plan of Care Date 07/27/16   Treatment Start Date 07/26/16   PT Charges   $PT [facility] Gait Training - code 0208 ([id number])     Faye RamsayJessica Kemya Shed, PT, DPT  Pager [phone]

## 2016-07-27 NOTE — Progress Notes (Signed)
Occupational Therapy Evaluation     07/27/16 1008   OT Tracking   OT Tracking OT Assigned   OT Last Visit   Visit (#) of Five 1   Precautions   Precautions used Yes   Weight Bearing Status WBAT   Fall Precautions General falls precautions   Spinal Precautions Yes   LDA Observation IV lines;Drain   Home Living (Prior to Admission)   Prior Living Situation Reported by patient   Type of Home 2 Story home   Bedroom Second floor   Bathroom Second floor  (half-bathroom on first floor)   # Steps to Enter Home 0   # Of Steps In Home (flight)   Bathroom Shower/Tub Walk-in Water quality scientist seat   Prior Function   Prior Function Reported by patient   Level of Independence Independent with ADLs and functional transfers;Independent ambulation;Independent with homemaking with ambulation;Driving;Working   Lives With [employer] Help From Independent   IADL Independent   Vocational Full time employment   Additional Comments Pt lives with spouse, who is taking time away from work and able to assist as needed upon d/c. 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  Pt reports spouse assists with donning socks and shoes at baseline 2/2 LBP, otherwise is independent with ADLs and IADLs. Pt reports spouse will complete IADLs and transportation upon d/c.    Pain Assessment   *Is the patient currently in pain? Yes   Additional Comments Pt complaining of minimal pain during session, refer to RN for pain mgmt. Pt c/o occasional lightheadedness, dizziness and nausea in standing, subsided with static standing and/or sitting, RN notified.    Vision    Current Vision Wears corrective lenses   Cognition   Additional Comments Pt A&Ox4, able to follow commands and converse appropriately throughout session. Some slight confusion noted while washing hands in bathroom, however likely 2/2 unfamiliarity with motion sensored dispensers.    Medication Management    Additional Comments Pt able to report her baseline medications, stating she takes them directly from bottle.   Perception   Perception No deficit noted   Sensation   Sensation No apparent deficit  (in BUEs)   UE Assessment   UE Assessment Full AROM RUE;Full AROM LUE   Additional Comments BUE strength gingerly tested 2/2 precautions, at least 3+/5 MM. Slight resting tremor noted bilaterally.   Bed Mobility   Supine to Sit Moderate Assist   Additional Comments Pt educated on log rolling technique, with verbal and tactile cues for sequencing and technique throughout.   Functional Transfers   Stand pivot transfer Contact Guard   Sit to [employer]   Stand to [employer] Transfers Minimal assist   Additional Comments Functional mobility consistent with Parkinson's symptoms, completed in room with RW. Toilet transfer completed on standard seat with RW and use for sink for stabilization, min A for eccentric control. Pt reports grab bars can be installed into bathroom at home if needed.   Balance   Sitting - Static Independent    Sitting - Dynamic Supervision   Standing - Static Contact Guard   Standing - Dynamic Minimal Assist   Standing Tolerance during Functional Task Fair, limited by dizziness   ADL Assessment   Eating Independent   Grooming Contact guard   Where Grooming Assessed Standing at sink   Assist Needed With: Increased  time to complete  (decreased balance)   Areas Assessed: Washing hands   UE Dressing Set up   Where UE Dressing Assessed Chair   LE Dressing Maximum Assist   Where  LE Dressing Assessed Chair;Standing   Assist Needed With: Pants over feet;Pants up to thighs;Socks;Decreased balance;Increased time   Equipment Used Walker   Bathing Minimal Assist  (clinical estimate)   Assist Needed With Left lower leg including foot;Right lower leg including foot   Toileting Contact guard   Where Toileting Assessed Standard toilet   Assist needed with Steadying;Increased time to complete    Additional Comments ADLs completed/simulated with level of assist as dictated above. Increased time require for all ADLs, consistent with PD.    Activity Tolerance   Endurance Tolerates 30 min activity with multiple rests   Assessment   Assessment Impaired ADL status;Impaired balance;Impaired endurance;Impaired fine motor control;Impaired self-care transfers;Impaired instrumental ADL's;Impaired UE strength   Plan   OT Frequency 3-5x/wk   Patient Will Benefit From ADL retraining;Functional transfer training;UE strengthening/ROM;Endurance training;Patient/Family training;Equipment eval/education;Fine motor coordination activities;Gross motor activities;Compensatory technique education;IADL training;Exercises   Multidisciplinary Communication   Multidisciplinary Communication RN, PT   Recommendation   OT Discharge Recommendations Anticipate return to prior living environment with intermittent supervision/assist   Additional Comments Pt currently experiencing a functional decline with self-care performance requiring increased assist compared to baseline. Anticipate functional improvement once stiffness and nausea/dizziness improve, however at this time, recommend assist with all self-care tasks and transfers, which spouse is able to provide. If pt progresses to modified independence with toileting and functional mobility during this hospitalization with PT or nursing, only intermittent assist will be required upon d/c.     Timed Calculations:  Timed Codes:  0  Untimed Codes: 42 minute eval  Unbilled Time: 0  Total Time:  42 minutes    Linda Arroyo, OTR/L [phone]  Please contact the OT pager [phone]) for all questions/concerns and/or update requests.      OT Evaluation Complexity    1.  Chart Review          A expanded review of Patient's Occupational Profile & Medical History was performed.     2.  Occupational Performance          Assessment of patient occupations reveals deficits including:      a.  ADL -  Grooming, UB dressing, LB dressing, Bathing and Toileting      b. 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 Rest/Sleep - N/A      d. Education - N/A      e. Work - N/A      f. Play - N/A      g. Leisure - Yes      h. Social Participation - Yes    3.  Decision Making          Analysis of data from Detailed Evaluation        A. Some modification needed to complete evaluation.      B. Comorbidities will affect recovery.      C. Several treatment options exist.    4.  Evaluation Complexity is moderate based on the above.

## 2016-07-27 NOTE — Progress Notes (Signed)
07/27/16 0904   [facility] Patient Class Review   Patient Class Review Inpatient     Patient Class Effective as of 07/26/2016    Gerome SamLili D Sagajllo, RN     Pager: [phone]

## 2016-07-27 NOTE — Progress Notes (Signed)
Patient:Linda Arroyo  MRN: [id number]  DOA: 07/26/2016  Ortho Spine Progress Note for 07/27/2016    Events:   NAE overnight    Vitals:  BP: (87-137)/(50-92)   Temp:  [36.1 C (97 F)-36.5 C (97.7 F)]   Temp src: Temporal (07/09 2232)  Heart Rate:  [87-98]   Resp:  [8-17]   SpO2:  [94 %-100 %]     Intake/Output:  I/O this shift:  07/09 2300 - 07/10 0659  In: 0.4 (0 mL/kg) [I.V.:0.4]  Out: 650 (10.6 mL/kg) [Urine:275; Drains:375]  Net: -649.6  Weight: 61.1 kg     Recent Labs:    Recent Labs  Lab 07/26/16  2208   WBC 10.4*   Hematocrit 27*   Creatinine 1.12*   Platelets 257   Potassium 5.3*       Physical Exam:  NAD, AAOx3  Dressing c/d/i  Drain:375 last 2 shifts      Motor:    LE:          Hip Flex            Leg Ext           Ankle Dorsiflex          Great toe Ext           Ankle Plantarflexion  R        5                           5                        5                             5                                    5   L        5                            5                        5                             5                                    5       Sensory:    LE:            L2  L3   L4  L5  S1  R      Intact                   Intact                 Intact                 Intact                Intact     L      Intact  Intact                 Intact                 Intact                Intact        Assessment and Plan:  61 y.o. female admitted on 07/26/2016 now s/p lumbar decompression and PSIF    -Analgesia: disc PCA, PO meds  -Diet: Reg  -DVT Prophylaxis: Ambulate, SCD  -Weight-bearing status: WBAT  -PT/OT/OOB  -Abx complete  -Dispo: pending    Brion Aliment, MD, Orthopaedic Spine Fellow  07/27/2016  6:50 AM

## 2016-07-27 NOTE — Plan of Care (Signed)
Cognitive function    • Cognitive function will be maintained or return to baseline Progressing towards goal        Impaired Balance    • Patient will maintain balance to allow for safe mobility and Progressing towards goal        Impaired Mobility (musc)    • Patient's mobility is maintained or improved (musc) Progressing towards goal        Mobility    • Patient's functional status is maintained or improved Progressing towards goal        Nutrition    • Patient's nutritional status is maintained or improved Progressing towards goal        Pain/Comfort    • Patient's pain or discomfort is manageable Progressing towards goal        Post-Operative Bladder Elimination    • Patient is able to empty bladder or return to baseline Progressing towards goal        Post-Operative Bowel Elimination    • Elimination pattern is normal or improving Progressing towards goal        Post-Operative Complications    • Prevent post-operative complications Progressing towards goal    • Patient will remain free from symptoms of infection-post op Progressing towards goal        Post-Operative Hemodynamic Stability    • Maintain Hemodynamic Stability Progressing towards goal        Potential for impaired Circulation, Motor, or Sensation    • Patient's CMS status is maintained or improved from baseline Progressing towards goal        Psychosocial    • Demonstrates ability to cope with illness Progressing towards goal        Safety    • Patient will remain free of falls Progressing towards goal    • Prevent any intentional injury Progressing towards goal

## 2016-07-27 NOTE — Progress Notes (Signed)
Physical Therapy Initial Evaluation:     History of Present Admission: 61 y.o. female admitted on 07/26/2016 now s/p lumbar decompression and PSIF    Past Medical History:   Diagnosis Date    Parkinson's disease         Past Surgical History:   Procedure Laterality Date    ANTERIOR CRUCIATE LIGAMENT REPAIR      APPENDECTOMY      PR ARTHRODESIS POSTERIOR/POSTEROLATERAL LUMBAR N/A 07/26/2016    Procedure: L4-L5 decompression, L4-S1  instrumented fusion and Right ICBG;  Surgeon: Mardee PostinMolinari, Robert, MD;  [facility];  Service: Orthopedics       Personal factors affecting treatment/recovery:   none identified    Comorbidities affecting treatment/recovery:   Parkinson's disease    Clinical presentation:   stable    Patient complexity:     low level as indicated by above stability of condition, personal factors, environmental factors and comorbidities in addition to their impairments found on physical exam.     07/27/16 1100   Prior Living    Prior Living Situation Reported by patient   Lives With Spouse   Receives Help From Independent   Type of Home 2 Story home   # Steps to Enter Home 5   # Rails to Erie Insurance GroupEnter Home 1   # Of Steps In Home ("flight")   # Rails in Home 1   Location of Bedrooms 2nd floor   Location of Bathrooms 2nd floor   Medical Equipment in Home None   Additional Comments Pt states that her husband will be able to assist her at home as needed with mobility and ADLs. She was independent with all mobility prior to admisison.    Prior Function Level   Prior Function Level Reported by patient   Transfers Independent   Transfer Devices None   Walking Independent   Walking assistive devices used None   Stair negotiation Independent   PT Tracking   PT TRACKING PT Assigned   Visit Number   Visit Number [facility] / Treatment Day (HH) 1   Precautions/Observations   Precautions used Yes   LDA Observation Drain   Fall Precautions General falls precautions   Pain Assessment   *Is the patient currently in pain? Yes    Pain (Before,During, After) Therapy Before;During;After   0-10 Scale (did not quantify)   Pain Location Abdomen;Back   Pain Orientation Lower   Pain Intervention(s) Repositioned;Refer to nursing for pain management   Additional comments Pt also complaining of increasing nausea during PT, especially with movement.    Vision    Current Vision Wears corrective lenses   Cognition   Additional Comments Alert, follows commands without difficulty   UE Assessment   UE Assessment Full range RUE AROM;Full range LUE AROM   LUE Strength   Overall Strength WFL able to perform ADL tasks with strength   RUE Strength   Overall Strength WFL able to perform ADL tasks with strength   LE Assessment   LE Assessment Full AROM RLE;Full AROM LLE   Strength LLE   Overall Strength WFL able to perform ADL tasks with strength   Strength RLE   Overall Strength WFL able to perform ADL tasks with strength   Bed Mobility   Bed mobility Not tested   Additional comments Pt sitting up in recliner at beginning and end of session   Transfers   Transfers Tested   Sit to Stand Contact guard   Stand to sit Contact guard  Transfer Assistive Device none   Additional comments Pt performed sit<>stand 2x during session. Performs transfer very slowly, needing extra time to extend B knees and hips. She also relies on BUE support on armrests and back of legs against the chair for maintaining balance. Upon standing each time, pt complaining of increasing nausea, needing to sit back down.    Mobility   Mobility Not tested (comment)   Additional comments Deferred at this time due to increased nausea   Balance   Balance Tested   Sitting - Static Independent ;Supported   Sitting - Dynamic Supported  (stand by)   Standing - Forensic psychologist guard;Unsupported   PT AM-PAC Mobility   Turning over in bed? 1   Sitting down on and standing up from a chair with arms? 1   Moving from lying on back to sitting on the side of the bed? 1   Moving to and from a bed to a chair? 1    Need to walk in hospital room? 1   Climbing 3 - 5 steps with a railing? 1   Total Raw Score 6   Standardized Score 23.55   CMS 1-100% Score 100   Additional Comments   Additional comments Pt is primarily limited by pain, nausea, and decreased activity tolerance, needing at least contact guard for transfers at this time. Made nurse aware of complaints of nausea. Will complete full mobility assessment when pt is able.    Assessment   Brief Assessment Appropriate for skilled therapy   Problem List Pain contributing to impairment;Impaired transfers;Impaired ambulation;Impaired bed mobility;Impaired stair navigation;Impaired balance   Patient / Family Goal return home   Plan/Recommendation   Treatment Interventions Restorative PT;Assess functional mobility;Bed mobility training;Transfers training;Balance training;Stair training;Pt/Family education;Strengthening;Gait training;D/C planning   PT Frequency 3-5x/wk   Hospital Stay Recommendations Out of bed with nursing assist;Ambulate daily with nursing assist   Discharge Recommendations Unable to determine at this time   PT Discharge Equipment Recommended To be determined   Assessment/Recommendations Reviewed With: Nursing;Occupational Therapy;Patient   Next Visit Functional evaluation. Progress transfers as able.    Time Calculation   PT Timed Codes 0   PT Untimed Codes 24   PT Unbilled Time 0   PT Total Treatment 24   Plan and Onset date   Plan of Care Date 07/27/16   Onset Date 07/26/16   Treatment Start Date 07/26/16   PT Charges   $PT [facility] Charges Acute PT Eval Low Complexity - code 7161     Faye Ramsay, PT, DPT  Pager [phone]

## 2016-07-27 NOTE — Plan of Care (Signed)
Problem: Impaired Bed Mobility  Goal: STG - IMPROVE BED MOBILITY  Patient will perform bed mobility without rails and the head of bed flat with Modified independence     Time frame: 3-5 days    Problem: Impaired Transfers  Goal: STG - IMPROVE TRANSFERS  Patient will complete Sit to stand transfers using least restrictive assistive device with Modified independence     Time frame: 5-7 days    Problem: Impaired Ambulation  Goal: STG - IMPROVE AMBULATION  Patient will ambulate 100-149 feet using least restrictive assistive device with Modified independence    Time frame: 5-7 days    Problem: Impaired Stair Navigation  Goal: STG - IMPROVE STAIR NAVIGATION  Patient will navigate 8-11 steps with 1  rail(s) and least restrictive assistive device and Modified independence     Time frame: 5-7 days

## 2016-07-28 LAB — CBC AND DIFFERENTIAL
Baso # K/uL: 0 10*3/uL (ref 0.0–0.1)
Basophil %: 0.2 %
Eos # K/uL: 0.2 10*3/uL (ref 0.0–0.4)
Eosinophil %: 1.7 %
Hematocrit: 22 % — ABNORMAL LOW (ref 34–45)
Hemoglobin: 7.4 g/dL — ABNORMAL LOW (ref 11.2–15.7)
IMM Granulocytes #: 0 10*3/uL (ref 0.0–0.1)
IMM Granulocytes: 0.4 %
Lymph # K/uL: 1.9 10*3/uL (ref 1.2–3.7)
Lymphocyte %: 21.7 %
MCH: 32 pg/cell (ref 26–32)
MCHC: 34 g/dL (ref 32–36)
MCV: 94 fL (ref 79–95)
Mono # K/uL: 0.9 10*3/uL (ref 0.2–0.9)
Monocyte %: 10.4 %
Neut # K/uL: 5.8 10*3/uL (ref 1.6–6.1)
Nucl RBC # K/uL: 0 10*3/uL (ref 0.0–0.0)
Nucl RBC %: 0 /100 WBC (ref 0.0–0.2)
Platelets: 186 10*3/uL (ref 160–370)
RBC: 2.4 MIL/uL — ABNORMAL LOW (ref 3.9–5.2)
RDW: 12.2 % (ref 11.7–14.4)
Seg Neut %: 65.6 %
WBC: 8.9 10*3/uL (ref 4.0–10.0)

## 2016-07-28 LAB — BASIC METABOLIC PANEL
Anion Gap: 7 (ref 7–16)
CO2: 31 mmol/L — ABNORMAL HIGH (ref 20–28)
Calcium: 8.7 mg/dL (ref 8.6–10.2)
Chloride: 102 mmol/L (ref 96–108)
Creatinine: 0.63 mg/dL (ref 0.51–0.95)
GFR,Black: 111 *
GFR,Caucasian: 96 *
Glucose: 126 mg/dL — ABNORMAL HIGH (ref 60–99)
Lab: 9 mg/dL (ref 6–20)
Potassium: 4 mmol/L (ref 3.3–5.1)
Sodium: 140 mmol/L (ref 133–145)

## 2016-07-28 LAB — HCT AND HGB
Hematocrit: 25 % — ABNORMAL LOW (ref 34–45)
Hemoglobin: 8.3 g/dL — ABNORMAL LOW (ref 11.2–15.7)

## 2016-07-28 LAB — TYPE AND SCREEN
ABO RH Blood Type: AB NEG
Antibody Screen: NEGATIVE

## 2016-07-28 LAB — MCHC: MCHC: 33 g/dL (ref 32–36)

## 2016-07-28 MED ORDER — HUGO ROLLING WALKER BASIC MISC
1.0000 [IU] | 0 refills | Status: AC | PRN
Start: 2016-07-28 — End: 2016-09-26

## 2016-07-28 MED ORDER — CALCIUM CARBONATE ANTACID 500 MG PO CHEW *I*
1000.0000 mg | CHEWABLE_TABLET | Freq: Two times a day (BID) | ORAL | Status: DC
Start: 2016-07-28 — End: 2016-07-29
  Administered 2016-07-28 – 2016-07-29 (×3): 1000 mg via ORAL
  Filled 2016-07-28 (×3): qty 2

## 2016-07-28 MED ORDER — HOSPITAL BED MISC *A*
0 refills | Status: DC
Start: 2016-07-28 — End: 2016-07-29

## 2016-07-28 NOTE — Progress Notes (Signed)
Physical Therapy Treatment Note:       07/28/16 1500   Visit Number   Visit Number [facility]) / Treatment Day (HH) 4  (BID visit)   Precautions/Observations   Precautions used Yes   LDA Observation Drain   Fall Precautions General falls precautions   Pain Assessment   *Is the patient currently in pain? Yes   Pain (Before,During, After) Therapy During   0-10 Scale ("better than earlier")   Pain Location Back   Pain Orientation Lower   Pain Intervention(s) Repositioned;Refer to nursing for pain management   Cognition   Additional Comments Alert, follows commands without difficulty   Bed Mobility   Bed mobility Not tested   Additional comments Pt sitting up in recliner at beginning and end of session   Transfers   Transfers Tested   Sit to Stand Stand by assistance   Stand to sit Stand by assistance   Transfer Assistive Device rolling walker   Additional comments Verbal cues for safe technique and proper hand placement. Peforms transfers very slowly. Pt reported only mild lightheadedness upon initial standing, resolved quickly.    Mobility   Mobility Tested   Gait Pattern Decreased cadence   Ambulation Assist Stand by   Ambulation Distance (Feet) 250   Ambulation Assistive Device rolling walker   Additional comments Pt occasionally runs into obstacles with rolling walker, more so on her L side, cues to correct.    Therapeutic Exercises   Additional comments Encouraged pt to ambulate 3x per day to her tolerance.    Balance   Balance Tested   Sitting - Static Independent    Sitting - Dynamic Independent   Standing - Static Supported  (stand by)   Standing - Dynamic Supported  (stand by)   PT AM-PAC Mobility   Turning over in bed? 1   Sitting down on and standing up from a chair with arms? 1   Moving from lying on back to sitting on the side of the bed? 1   Moving to and from a bed to a chair? 3   Need to walk in hospital room? 3   Climbing 3 - 5 steps with a railing? 1   Total Raw Score 10   Standardized Score 32.29   CMS  1-100% Score 77   Assessment   Brief Assessment Appropriate for skilled therapy   Plan/Recommendation   Treatment Interventions Restorative PT   PT Frequency 3-5x/wk   Hospital Stay Recommendations Ambulate daily with nursing assist;May ambulate with family assist   Discharge Recommendations Anticipate return to prior living arrangement;Intermittent supervision/assist;Home PT   PT Discharge Equipment Recommended To be determined  (anticipate rolling walker)   Assessment/Recommendations Reviewed With: Nursing;Patient   Next Visit Progress transfers and ambulation. Assess stairs as able.    Time Calculation   PT Timed Codes 25   PT Untimed Codes 0   PT Unbilled Time 0   PT Total Treatment 25   Plan and Onset date   Plan of Care Date 07/27/16   Treatment Start Date 07/26/16   PT Charges   $PT [facility] Gait Training - code 0208 (2915minx2)     Faye RamsayJessica Zeus Marquis, PT, DPT  Pager [phone]

## 2016-07-28 NOTE — Progress Notes (Addendum)
Patient:Linda Arroyo  MRN: [id number]  DOA: 07/26/2016  Ortho Spine Progress Note for 07/28/2016    Events:   NAE overnight, pain controlled, did well with PT yesterday, ambulated. Tolerating diet, voiding spontaneously. Hct 23 (27). No other issues or concerns this am.    Vitals:  BP: (95-134)/(53-70)   Temp:  [35.7 C (96.3 F)-37.4 C (99.3 F)]   Temp src: Temporal (07/11 0400)  Heart Rate:  [80-103]   Resp:  [16]   SpO2:  [93 %-98 %]     Intake/Output:  I/O this shift:  07/10 2300 - 07/11 0659  In: - (0 mL/kg)   Out: 125 (2 mL/kg) [Drains:125]  Net: -125  Weight: 61.1 kg     Recent Labs:    Recent Labs  Lab 07/27/16  2119 07/26/16  2208   WBC 7.6 10.4*   Hematocrit 23* 27*   Creatinine 0.67 1.12*   Platelets 173 257   Potassium 4.4 5.3*       Physical Exam:  NAD, AAOx3  Dressing c/d/i  Drain:150/--/125      Motor:    LE:          Hip Flex            Leg Ext           Ankle Dorsiflex          Great toe Ext           Ankle Plantarflexion  R        5                           5                        5                             5                                    4   L        5                            5                        5                             5                                   4       Sensory:    LE:            L2  L3   L4  L5  S1  R      Intact                   Intact                 Intact                 Intact  Intact     L      Intact                   Intact                 Intact                 Intact                Intact        Assessment and Plan:  61 y.o. female admitted on 07/26/2016 now s/p lumbar decompression and L4-S1 PSIF w/ ICBG.    -Analgesia: Po meds  - ABLA- Hct 23 (27)- continue to monitor  -Diet: Reg  -DVT Prophylaxis: Ambulate, SCD, likely lovenox tonight if not discharged today.  -Weight-bearing status: WBAT, AAT  -PT/OT/OOB  -Abx complete  -Dispo: pending PT and drain, possibly today pending clearance     Linda Carrow, MD, PGY-2  07/28/2016  5:57 AM      I  saw and evaluated the patient. I agree with the resident's/fellow's findings and plan of care as documented above.                                                                     Linda Postin, MD

## 2016-07-28 NOTE — Progress Notes (Signed)
Met with patient to discuss discharge planning needs.  Patient agreeable to home care referral with [facility] and message left on Claiborne Billings RN to notify that patient will probably need Home PT and  equipment.  Patient lives in tri level home with 6 steps to entrance of home and then 7 steps to main living level with has full bathroom and bedroom.  Patient states that she has good support system with family.  PCP Aundria Mems MD  [facility]: [facility] on [address] in [place]  Patient has obtained hospital bed and will be getting rolling walker from friends.   Would prefer late am appointments when scheduling.  Demographics and phone confirmed with patient.  [facility] will continue to follow and coordinate discharge planning needs.  Perlie Gold, RN BSN  Acute Care Coordinator [phone]  [phone]    Pager [phone]  Weekend coverage pager [phone]

## 2016-07-28 NOTE — Progress Notes (Signed)
Physical Therapy Treatment Note:       07/28/16 1000   Visit Number   Visit Number [facility]) / Treatment Day (HH) 3   Precautions/Observations   Precautions used Yes   LDA Observation Drain   Fall Precautions General falls precautions   Pain Assessment   *Is the patient currently in pain? Yes   Pain (Before,During, After) Therapy During   0-10 Scale (did not quantify)   Pain Location Back   Pain Orientation Lower   Pain Intervention(s) Repositioned;Refer to nursing for pain management   Additional comments Pt complaining of some muscle tightness along her lower back and anterior thighs, states that she gets some relief from heat packs and ambulation.   Cognition   Additional Comments Alert, follows commands without difficulty   Bed Mobility   Bed mobility Tested   Supine to Sit Stand by assistance;Side rails up (#);Head of bed elevated   Additional comments Verbal cues for safe technique and sequencing. No physical assist needed to complete safely. Pt states that her husband has rented a hospital bed for home. She performs bed mobility very slowly.   Transfers   Transfers Tested   Sit to Stand Stand by assistance   Stand to sit Stand by assistance   Transfer Assistive Device rolling walker   Additional comments Verbal cues for proper hand placement and safe technique. Pt having some difficulty pushing off with R hand due to IV placement.    Mobility   Mobility Tested   Gait Pattern Decreased cadence   Ambulation Assist Stand by   Ambulation Distance (Feet) 250   Ambulation Assistive Device rolling walker   Additional comments Gait speed is slow but steady. Pt states that she had some pain relief with ambulation. She appears to rely on rolling walker for balance.    Therapeutic Exercises   Additional comments Encouraged pt to ambulate 3x per day with assist to her tolerance.    Balance   Balance Tested   Sitting - Static Independent    Sitting - Dynamic Independent   Standing - Static Supported  (stand by)   Standing -  Dynamic Supported  (stand by)   PT AM-PAC Mobility   Turning over in bed? 1   Sitting down on and standing up from a chair with arms? 1   Moving from lying on back to sitting on the side of the bed? 1   Moving to and from a bed to a chair? 3   Need to walk in hospital room? 3   Climbing 3 - 5 steps with a railing? 1   Total Raw Score 10   Standardized Score 32.29   CMS 1-100% Score 77   Assessment   Brief Assessment Appropriate for skilled therapy   Plan/Recommendation   Treatment Interventions Restorative PT   PT Frequency 3-5x/wk   Hospital Stay Recommendations Ambulate daily with nursing assist;May ambulate with family assist   Discharge Recommendations Anticipate return to prior living arrangement;Intermittent supervision/assist;Home PT   PT Discharge Equipment Recommended To be determined  (anticipate rolling walker)   Assessment/Recommendations Reviewed With: Nursing;Patient   Next Visit Progress transfers and ambulation. Assess stairs as able.    Time Calculation   PT Timed Codes 26   PT Untimed Codes 0   PT Unbilled Time 0   PT Total Treatment 26   Plan and Onset date   Plan of Care Date 07/27/16   Treatment Start Date 07/26/16   PT Charges   [facility]PT [facility]  Charges Gait Training - code [phone] (28minx2)     Faye Ramsay, PT, DPT  Pager [phone]

## 2016-07-28 NOTE — Plan of Care (Signed)
Cognitive function     Cognitive function will be maintained or return to baseline Adequate for discharge        Nutrition     Patient's nutritional status is maintained or improved Adequate for discharge        Post-Operative Bladder Elimination     Patient is able to empty bladder or return to baseline Adequate for discharge        Safety     Prevent any intentional injury Adequate for discharge          Impaired Balance     Patient will maintain balance to allow for safe mobility and Maintaining        Impaired Mobility (musc)     Patient's mobility is maintained or improved (musc) Maintaining        Mobility     Patient's functional status is maintained or improved Maintaining        Pain/Comfort     Patient's pain or discomfort is manageable Maintaining        Post-Operative Bowel Elimination     Elimination pattern is normal or improving Maintaining        Post-Operative Complications     Prevent post-operative complications Maintaining     Patient will remain free from symptoms of infection-post op Maintaining        Post-Operative Hemodynamic Stability     Maintain Hemodynamic Stability Maintaining        Potential for impaired Circulation, Motor, or Sensation     Patient's CMS status is maintained or improved from baseline Maintaining        Psychosocial     Demonstrates ability to cope with illness Maintaining        Safety     Patient will remain free of falls Maintaining

## 2016-07-29 MED ORDER — HOSPITAL BED MISC *A*
0 refills | Status: DC
Start: 2016-07-29 — End: 2016-11-16

## 2016-07-29 MED ORDER — ACETAMINOPHEN 500 MG PO TABS *I*
1000.0000 mg | ORAL_TABLET | Freq: Three times a day (TID) | ORAL | 0 refills | Status: AC | PRN
Start: 2016-07-29 — End: ?

## 2016-07-29 MED ORDER — SODIUM CHLORIDE 0.9 % IV SOLN WRAPPED *I*
3.0000 mL/h | Status: DC
Start: 2016-07-29 — End: 2016-07-29
  Administered 2016-07-29: 3 mL/h via INTRAVENOUS

## 2016-07-29 MED ORDER — OXYCODONE HCL 5 MG PO TABS *I*
5.0000 mg | ORAL_TABLET | ORAL | 0 refills | Status: AC | PRN
Start: 2016-07-29 — End: ?

## 2016-07-29 NOTE — Progress Notes (Addendum)
Patient:Linda Arroyo  MRN: [id number]  DOA: 07/26/2016  Ortho Spine Progress Note for 07/29/2016    Events:   NAE overnight, pain controlled, did well with PT yesterday, ambulated. Orthostatic when getting OOB, also intermittently tachycardic to the 100s, Hct 22 this am. Tolerating diet, voiding spontaneously. No other issues or concerns this am.    Vitals:  BP: (107-135)/(56-72)   Temp:  [36 C (96.8 F)-36.4 C (97.5 F)]   Temp src: Temporal (07/11 2226)  Heart Rate:  [76-110]   Resp:  [14-17]   SpO2:  [95 %-96 %]     Intake/Output:  I/O this shift:  07/11 2300 - 07/12 0659  In: - (0 mL/kg)   Out: 30 (0.5 mL/kg) [Drains:30]  Net: -30  Weight: 61.1 kg     Recent Labs:    Recent Labs  Lab 07/28/16  2232 07/28/16  0930 07/27/16  2119 07/26/16  2208   WBC 8.9  --  7.6 10.4*   Hematocrit 22* 25* 23* 27*   Creatinine 0.63  --  0.67 1.12*   Platelets 186  --  173 257   Potassium 4.0  --  4.4 5.3*       Physical Exam:  NAD, AAOx3  Dressing c/d/i  Drain: --/--/30      Motor:    LE:          Hip Flex            Leg Ext           Ankle Dorsiflex          Great toe Ext           Ankle Plantarflexion  R        5                           5                        5                             5                                    4   L        5                            5                        5                             5                                   4       Sensory:    LE:            L2  L3   L4  L5  S1  R      Intact                   Intact  Intact                 Intact                Intact     L      Intact                   Intact                 Intact                 Intact                Intact        Assessment and Plan:  61 y.o. female admitted on 07/26/2016 now s/p lumbar decompression and L4-S1 PSIF w/ ICBG.    -Analgesia: Po meds  - ABLA- Hct 22- continue to monitor  -Diet: Reg  -DVT Prophylaxis: Ambulate, SCD, likely lovenox tonight if not discharged today.  -Weight-bearing status: WBAT,  AAT  -PT/OT/OOB  -Abx complete  -Dispo: pending PT and drain, Hct    Loletha Carrow, MD, PGY-2  07/29/2016  6:15 AM      I saw and evaluated the patient. I agree with the resident's/fellow's findings and plan of care as documented above.                                                                     Mardee Postin, MD

## 2016-07-29 NOTE — Discharge Instructions (Signed)
Call:  Call your Surgeon’s office promptly if you experience any of these symptoms:  fever > 101.5 F, redness around surgical site, drainage from incision, change in numbness or tingling or weakness, inability to urinate or have a bowel movement, chills, night sweats, pain not relieved by medications.    If you cannot reach your Surgeon, then call the Answering Service (on-call phone number:  [phone]).  If you experience a medical emergency (such as chest pain or shortness of breath) then proceed directly to the Emergency Department.       Activity:  No lifting.  No excessive or repetitive bending or twisting.  Do not drive until instructed that you may do so by your physician.    Incision/Wound Care:  Keep incision covered until your follow up appointment.  You may shower while incision is covered with saran wrap / clear dressing.  No tub baths.     Medications: Take tylenol for pain as directed. For increased pain, take your Oxycodone as directed. If you are taking Oxycodone, take a stool softener every day.    Dr. Molinari:  appointments [phone], office [phone].

## 2016-07-29 NOTE — Progress Notes (Addendum)
[facility] @ [phone].  [facility] [phone] and walker needs to be delivered to hospital room for discharge.  New script for hospital bed to read semi electric hospital bed and [facility]  Follow up appointment scheduled with Earnestine LeysWilliam Gruhn PA 08/09/16 and Burley Saverorie Lisle NP (PCP) 08/03/16  Kasandra KnudsenKaren Trayonna Bachmeier, RN BSN  Acute Care Coordinator [phone]  [phone]    Pager [phone]  Weekend coverage pager [phone]

## 2016-07-29 NOTE — Progress Notes (Signed)
AVS reviewed with pt and pt's husband. All questions answered. Wheelchair at bedside. Medications being picked up from outpt pharmacy by husband. PIV removed. Wheelchair ordered for D/C.

## 2016-07-29 NOTE — Progress Notes (Signed)
Home Health Assessment    Completed by: Malachy Moanina Rakayla Ricklefs, RN  Phone: [phone]      Referred by: Clydie BraunKaren CC      Source of Information: medical record      Home Health indicators present: May need PT, OT or ST at home      Barriers to discharge to be addressed: Home environment/structure Steps required to be used by patient      Plan: Home care referral started; agency chosen [facility]      Comments: Med Record reviewed, referral accepted. [facility] will monitor hospital course, facilitate equipment requirements.  Please call if questions or concerns.  Thank You  Malachy Moanina Iliya Spivack RN, CTM  [facility]

## 2016-07-29 NOTE — Plan of Care (Signed)
Impaired Ambulation    • STG - IMPROVE AMBULATION Completed or Resolved        Impaired Bed Mobility    • STG - IMPROVE BED MOBILITY Completed or Resolved        Impaired Stair Navigation    • STG - IMPROVE STAIR NAVIGATION Completed or Resolved        Impaired Transfers    • STG - IMPROVE TRANSFERS Completed or Resolved

## 2016-07-29 NOTE — Progress Notes (Signed)
Linda Arroyo requires the use of a semi electric hospital bed:    -for alleviation of pain, positioning of the body in ways not feasible with an ordinary bed, and  -requires a bed height different than a fixed height hospital bed to permit transfers to chair, wheelchair or a standing position.    Estevan Ryderim Winslow Verrill PA  Orthopaedic Spine Team  [phone]  Pic: [phone]

## 2016-07-29 NOTE — Progress Notes (Signed)
While assisting the patient to the bathroom a blister was noticed along the border of the ioban occlusive on her upper back. The blister was broken open and draining slightly. Polysporin ointment was applied and an allevyn gentle 5x5 was used to cover the area.     Jackquline DenmarkNina Courtlyn Aki, RN

## 2016-07-29 NOTE — Progress Notes (Signed)
Physical Therapy Discharge Note:       07/29/16 1400   PT Tracking   PT TRACKING PT Discontinue   Visit Number   Visit Number [facility]) / Treatment Day (HH) 0   Precautions/Observations   Precautions used Yes   LDA Observation Drain   Fall Precautions General falls precautions   Pain Assessment   *Is the patient currently in pain? Yes   Pain (Before,During, After) Therapy During   0-10 Scale ("mild")   Pain Location Back   Pain Orientation Lower   Pain Descriptors ("stinging")   Pain Intervention(s) Repositioned;Refer to nursing for pain management   Cognition   Additional Comments Alert, follows commands without difficulty   Bed Mobility   Bed mobility Not tested   Additional comments Pt sitting up in recliner at beginning and end of session, states that she has no difficulty with bed mobility and that her husband has rented a hospital bed for home.    Transfers   Transfers Tested   Sit to Stand Modified independent (device)   Stand to sit Modified independent (device)   Transfer Assistive Device rolling walker   Additional comments Pt demonstrates safe technique and proper hand placement upon standing, no reports of lightheadedness or dizziness upon standing.    Mobility   Mobility Tested   Gait Pattern Decreased cadence   Ambulation Assist Modified independent (device)   Ambulation Distance (Feet) 250   Ambulation Assistive Device rolling walker   Stairs Assistance Modified independent (device)   Stair Management Technique One rail;Sideways   Number of Stairs 10   Additional comments No unsteadiness or instability noted during ambulation or stair negotiation. Pt uses two hands on one railing for stair negotiation. Gait speed is slow but steady. Discussed progression of weening off of rolling walker.    Therapeutic Exercises   Additional comments Encouraged pt to ambulate at least 3x per day to her tolerance.    Balance   Balance Tested   Sitting - Static Independent    Sitting - Dynamic Independent   Standing -  Static Independent;Supported   Standing - Dynamic Independent;Supported   PT AM-PAC Mobility   Turning over in bed? 1   Sitting down on and standing up from a chair with arms? 1   Moving from lying on back to sitting on the side of the bed? 3   Moving to and from a bed to a chair? 4   Need to walk in hospital room? 4   Climbing 3 - 5 steps with a railing? 4   Total Raw Score 17   Standardized Score 42.13   CMS 1-100% Score 51   Assessment   Brief Assessment Patient demonstrates adequate mobility skills to return home;Skilled acute PT is not indicated   Plan/Recommendation   Treatment Interventions No further PT interventions   PT Frequency none further   Hospital Stay Recommendations Ambulate daily with nursing assist;May ambulate with family assist   Discharge Recommendations Anticipate return to prior living arrangement;Intermittent supervision/assist   PT Discharge Equipment Recommended Rolling Walker   Assessment/Recommendations Reviewed With: Nursing;Patient   Time Calculation   PT Timed Codes 25   PT Untimed Codes 0   PT Unbilled Time 0   PT Total Treatment 25   Plan and Onset date   Plan of Care Date 07/29/16   Treatment Start Date 07/26/16   PT Charges   $PT [facility] Functional Training - code 0239 (2915min);Gait Training - code [phone] (15min)  Berenda Morale, PT, DPT  Pager [phone]

## 2016-07-30 LAB — RED BLOOD CELLS
Coded Blood type: 2800
Component blood type: AB NEG
Dispense status: TRANSFUSED

## 2016-08-09 ENCOUNTER — Ambulatory Visit: Payer: PRIVATE HEALTH INSURANCE | Admitting: Orthopedic Surgery

## 2016-08-09 NOTE — Discharge Summary (Signed)
Name: Tyler PitaJean Jake MRN: [id number] DOB: 08/12/1955     Admit Date: 07/26/2016   Date of Discharge: 07/29/2016    Patient was accepted for discharge to   Home or Self Care [1]           Discharge Attending Physician: Mardee PostinMOLINARI, ROBERT      Hospitalization Summary    CONCISE NARRATIVE:   Admitted via SDA.  Uncomplicated postop course on [id number].  Pain eventually controlled on oral medications.  Voiding without difficulty and tolerating regular diet.  Cleared by Physical Therapy.  Ready for discharge on POD # 3.            OR PROCEDURE: L4-5 and L5-S1 bilateral lumbar decompression, partial laminectomy, partial facetectomy, bilateral foraminotomy, and L4 through S1 instrumented posterolateral fusion using Synthes titanium matrix polyaxial pedicle screws and rods and right posterior iliac crest bone graft.      Signed: Candelaria Stagersimothy A Sophiea Ueda, PA  On: 08/09/2016  at: 1:28 PM

## 2016-08-11 ENCOUNTER — Ambulatory Visit
Admission: RE | Admit: 2016-08-11 | Discharge: 2016-08-11 | Disposition: A | Discharge status: Home / Self Care | Payer: PRIVATE HEALTH INSURANCE | Source: Ambulatory Visit | Attending: Orthopedic Surgery | Admitting: Orthopedic Surgery

## 2016-08-11 ENCOUNTER — Ambulatory Visit: Payer: PRIVATE HEALTH INSURANCE | Admitting: Orthopedic Surgery

## 2016-08-11 ENCOUNTER — Encounter: Payer: Self-pay | Admitting: Orthopedic Surgery

## 2016-08-11 ENCOUNTER — Other Ambulatory Visit: Payer: Self-pay | Admitting: Orthopedic Surgery

## 2016-08-11 VITALS — BP 117/75 | HR 93 | Temp 99.0°F | Ht 66.0 in | Wt 135.0 lb

## 2016-08-11 DIAGNOSIS — M4316 Spondylolisthesis, lumbar region: Secondary | ICD-10-CM

## 2016-08-11 MED ORDER — CIPROFLOXACIN HCL 500 MG PO TABS *I*
500.0000 mg | ORAL_TABLET | Freq: Two times a day (BID) | ORAL | 1 refills | Status: AC
Start: 2016-08-11 — End: ?

## 2016-08-11 NOTE — Progress Notes (Signed)
Postoperative visit:    Operative Procedure: L4-5 and L5-S1 bilateral lumbar decompression, partial laminectomy, partial facetectomy, bilateral foraminotomy, and L4 through S1 instrumented posterolateral fusion using Synthes titanium matrix polyaxial pedicle screws and rods and right posterior iliac crest bone graft.    Surgery Date: 07/26/2016       She reports improvement of her symptoms as prior to surgery.  She denies fever, chills or drainage from her incision.  She denies difficulty with her bowels or bladder.    Pain    08/11/16 1302   PainSc:   1   PainLoc: Back       Current Outpatient Prescriptions on File Prior to Visit   Medication Sig Dispense Refill    hospital bed Use as directed 1 each 0    hospital bed Use as directed  Semi-electric 1 each 0    acetaminophen (TYLENOL) 500 mg tablet Take 2 tablets (1,000 mg total) by mouth 3 times daily as needed for Pain 100 tablet 0    oxyCODONE (ROXICODONE) 5 MG immediate release tablet Take 1 tablet (5 mg total) by mouth every 4 hours as needed   Max daily dose: 30 mg 80 tablet 0    Misc. Devices (HUGO ROLLING WALKER BASIC) MISC By 1 Units no specified route as needed 1 each 0    traMADol (ULTRAM) 50 MG tablet Take 50 mg by mouth every 6 hours as needed for Pain         carbidopa-levodopa (SINEMET) 25-100 MG per tablet TAKE 1 TABLET BY MOUTH THREE TIMES DAILY 270 tablet 1    MAGNESIUM LACTATE PO Take 400 mg by mouth every morning         Omega-3 Fatty Acids (OMEGA-3 FISH OIL PO) Take 2 capsules by mouth 2 times daily      CALCIUM LACTATE PO Take 1 tablet by mouth every morning          No current facility-administered medications on file prior to visit.          Vitals:    08/11/16 1302   BP: 117/75   Pulse: 93   Temp: 37.2 C (99 F)   Weight: 61.2 kg (135 lb)   Height: 1.676 m (5\' 6" )       Physical examination:  She appears to be well and in no acute distress.  Her surgical incision is well approximated. She has an area of swelling in the region of  her incision consistent with a seroma. After removing her staple there was a scant amount of serous drainage.  She is neurologically intact with respect to motor, sensory and reflex to the lower extremities bilaterally.  Her calves are soft and nontender, Homan's sign is negative bilaterally.    Radiographic images: Stable posterior instrumentation.         Assessment and plan: Linda Arroyo is recovering nicely 2 weeks after lumbar surgery.  I have reviewed the postoperative course with her in great detail today. She has been compliant with post-op wound instructions.  I am recommending slowly increasing activity to tolerance. I have given her Cipro 500 mg twice daily for 5 days for prophylaxis treatment of her superficial seroma. She will contact me with an update in a few days.  She will followup with Dr Ronney AstersMolinari in 4 weeks.  I have answered all her questions to her satisfaction.

## 2016-08-17 ENCOUNTER — Telehealth: Payer: Self-pay | Admitting: Orthopedic Surgery

## 2016-08-17 NOTE — Telephone Encounter (Signed)
She is calling to give you an update. She said there is a little fluid but not much left.

## 2016-08-17 NOTE — Telephone Encounter (Signed)
Thanks, call returned, I have answered all of her questions.

## 2016-08-30 ENCOUNTER — Other Ambulatory Visit: Payer: Self-pay | Admitting: Neurology

## 2016-08-30 ENCOUNTER — Other Ambulatory Visit: Payer: Self-pay | Admitting: Orthopedic Surgery

## 2016-08-30 DIAGNOSIS — M4316 Spondylolisthesis, lumbar region: Secondary | ICD-10-CM

## 2016-08-30 DIAGNOSIS — G20 Parkinson's disease: Secondary | ICD-10-CM

## 2016-09-07 ENCOUNTER — Ambulatory Visit: Payer: PRIVATE HEALTH INSURANCE | Admitting: Orthopedic Surgery

## 2016-09-07 ENCOUNTER — Ambulatory Visit
Admission: RE | Admit: 2016-09-07 | Discharge: 2016-09-07 | Disposition: A | Discharge status: Home / Self Care | Payer: PRIVATE HEALTH INSURANCE | Source: Ambulatory Visit | Attending: Orthopedic Surgery | Admitting: Orthopedic Surgery

## 2016-09-07 VITALS — BP 128/71 | HR 77 | Temp 97.5°F | Ht 66.0 in | Wt 137.0 lb

## 2016-09-07 DIAGNOSIS — Z09 Encounter for follow-up examination after completed treatment for conditions other than malignant neoplasm: Secondary | ICD-10-CM | POA: Insufficient documentation

## 2016-09-07 DIAGNOSIS — M549 Dorsalgia, unspecified: Secondary | ICD-10-CM | POA: Insufficient documentation

## 2016-09-07 DIAGNOSIS — M4316 Spondylolisthesis, lumbar region: Secondary | ICD-10-CM | POA: Insufficient documentation

## 2016-09-07 DIAGNOSIS — Z981 Arthrodesis status: Secondary | ICD-10-CM

## 2016-09-07 NOTE — Progress Notes (Signed)
6 weeks postop from routine lumbar decompressive and fusion  2 level surgery.    Patient's preoperative clinical symptoms are improved.    The incision is nicely healed.    xrays today wnl- screws, bone graft and alignment.    From a clinical standpoint, gait is normal and straight leg raising is normal.    Patient is pleased with the postoperative outcome at this time.    I encouraged continued activities as tolerated.    I will continue to follow 4 months to document fusion.

## 2016-11-16 ENCOUNTER — Ambulatory Visit: Payer: PRIVATE HEALTH INSURANCE | Attending: Neurology | Admitting: Neurology

## 2016-11-16 ENCOUNTER — Encounter: Payer: Self-pay | Admitting: Neurology

## 2016-11-16 VITALS — BP 108/60 | HR 102 | Ht 66.0 in | Wt 138.8 lb

## 2016-11-16 DIAGNOSIS — G20 Parkinson's disease: Secondary | ICD-10-CM

## 2016-11-16 MED ORDER — CARBIDOPA-LEVODOPA 25-100 MG PO TABS *I*
ORAL_TABLET | ORAL | 1 refills | Status: DC
Start: 2016-11-16 — End: 2017-05-29

## 2016-11-16 NOTE — Patient Instructions (Signed)
We recommend you increase your carbidopa/levodopa 25/100 mg to 1.5 tablets three times daily. Monitor for dizziness, dyskinesias, and confusion. Please contact us if you experience these side effects.    Carbidopa/Levodopa (Sinemet) 25/100 Strength Tablet Titration Schedule    Day  AM NOON  PM    1-3 1 1/2 1  1     4-6 1 1/2 1 1  1/2    7-9 on 1 1/2 1 1/2  1 1/2

## 2016-11-16 NOTE — Progress Notes (Signed)
Dear Dr. Tinnie Arroyo, Linda NoaWilliam, MD,    We recently had the opportunity to see Linda Arroyo at the [facility] for follow up of Parkinson disease.  She was accompanied by her husband.      She was last seen here in May of 2018 at which time no changes to medications were made.  Since then, she reports that she had back surgery in July for L4 and L5 decompression and instrumented fusion.  She reports that she did very well and is no longer experiencing right sciatica pain.  She noted that the experience was a good one and she is very happy with the results.  She noted that some days when she is very busy she feels slight pain in her arms and legs which she thinks it may be wearing off.  She noted that she is not experiencing dyskinesias, confusion and has not fallen.  She endorses difficulties falling asleep although she continues to take melatonin 3 mg at bedtime.  She endorses slight urinary problems, slight constipation and slight slowness with eating.  She endorses mild speech problems mostly low voice but she does not need to repeat herself every day.       She reports occasional orthostatic dizziness. She has "some anxiety" which she thinks happens towards the end of a dose. No depression. She has had some symptoms of RBD in the past which have not recurred recently.    She she was recently given the go-ahead to start working out although she does walk regularly.    Current Parkinson disease medications:   Carbidopa-levodopa 25-100mg  1 tab TID           The patient's medications, allergies, and active problem list were reviewed and updated as appropriate elsewhere in the electronic medical record.    REVIEW OF SYSTEMS: She completed a Parkinson's disease-specific 15 point review of systems questionnaire that I reviewed today and that will be scanned into the electronic medical record.     EXAMINATION:  Vitals:    11/16/16 1237 11/16/16 1241   BP: 132/68 108/60   BP Location: Left arm Left  arm   Patient Position: Sitting Standing   Cuff Size: adult adult   Pulse: 91 102   Weight: 63 kg (138 lb 12.8 oz)    Height: 1.676 m (5\' 6" )        GENERAL EXAM:  She appears comfortable.  No peripheral edema.    NEUROLOGICAL EXAM:  MENTAL STATUS:  She is awake and alert.  She is oriented to conversation. Speech is fluent.  Comprehension is intact.  Attention unimpaired.  Vocabulary is appropriate.  Affect is pleasant.      CRANIAL NERVES:  Ocular versions are full and conjugate.  There is no nystagmus.  Pursuits are smooth.  Facial expression is symmetric.  There is slight hypomimia and hypophonia.  No dysarthria.  Shoulder shrug is symmetric.      MOTOR: There is mild rigidity in the right wrist with augmentation only. There was no rigidity in the lower extremities. Finger taps, hand open-close, pronation supination, are mildly impaired, left greater than right. Heel and toe taps are normal bilaterally. There are no rest, action or postural tremors.    COORDINATION: No ataxia evident on finger-nose-finger testing.    GAIT: She is able to rise from a chair with arms crossed without difficulty. Casual gait is stable with normal base and stride length. Arm swing is reduced on the right greater than left.  She takes 1 step backwards on pull test.    IMPRESSION:  Linda Arroyo is a 61 y.o. 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  We have opted to slightly increase carbidopa-levodopa 5-100 mg to 1-1/2 tablets 3 times daily.  I provided her with a titration schedule and counseled her on possible side effects of dizziness, confusion and dyskinesias.  She will be traveling to [place] in November for the winter.  We encouraged her to contact our office if she needs medication refills while in [place] to provide a pharmacy.  Encouraged regular exercise and hydration.     PLAN:  1.  We recommend you increase your carbidopa/levodopa 25/100 mg to 1.5 tablets three times daily. Monitor  for dizziness, dyskinesias, and confusion. Please contact us if you experience these side effects.    Carbidopa/Levodopa (Sinemet) 25/100 Strength Tablet Titration Schedule    Day  AM NOON  PM    1-3 1 1/2 1  1     4-6 1 1/2 1 1  1/2    7-9 on 1 1/2 1 1/2  1 1/2     2. New script sent to your pharmacy.    3. Encouraged regular exercise and hydration.      Thank you for the opportunity to provide care for your patient.      Sande Brothers, AGNP

## 2017-03-22 ENCOUNTER — Encounter: Payer: Self-pay | Admitting: Neurology

## 2017-05-29 ENCOUNTER — Other Ambulatory Visit: Payer: Self-pay | Admitting: Neurology

## 2017-05-29 DIAGNOSIS — G20 Parkinson's disease: Secondary | ICD-10-CM

## 2017-06-01 ENCOUNTER — Telehealth: Payer: Self-pay

## 2017-06-01 NOTE — Telephone Encounter (Signed)
Received ROI from [facility]. Faxed to medical records and copy sent to scanning.

## 2017-06-30 ENCOUNTER — Telehealth: Payer: Self-pay

## 2017-06-30 NOTE — Telephone Encounter (Signed)
Received ROI from [facility]. Faxed to medical records and copy sent to scanning.

## 2017-08-07 ENCOUNTER — Other Ambulatory Visit: Payer: Self-pay | Admitting: Neurology

## 2017-08-07 DIAGNOSIS — G20 Parkinson's disease: Secondary | ICD-10-CM

## 2017-08-08 NOTE — Telephone Encounter (Signed)
Sent mychart message to patient to see if she's established care with a [place] who can prescribe her medication.

## 2023-01-24 IMAGING — MR MRI BRAIN WITHOUT CONTRAST
8 of 11 series · 25 of 48 positions shown · IV contrast (gadolinium)
Comparison: None.

________________________________________________________________________________________________ 
MRI BRAIN WITHOUT CONTRAST, 01/24/2023 [DATE]: 
CLINICAL INDICATION: Other symptoms and signs w cognitive functions and 
awareness . Memory loss, confusion, frequent falls.
TECHNIQUE: Multiplanar, multiecho position MR images of the brain were performed 
without intravenous gadolinium enhancement. Patient was scanned on a
magnet.

[Series 102: mpr - smartbrain · axial · 1.1mm · 1.09mm/px · 1 of 2 slices shown]
[im 1/2]
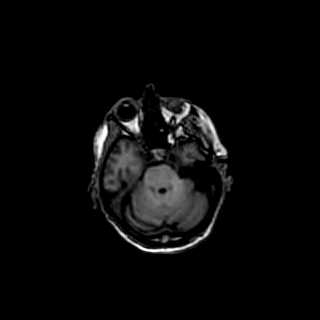

[Series 203: dadc map · axial · 5.0mm · 1.00mm/px · z∈[-74,+80]mm · 2 of 27 slices shown (1 of 2)]
[im 1/27]
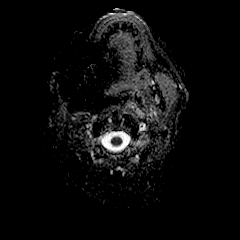
[im 27/27]
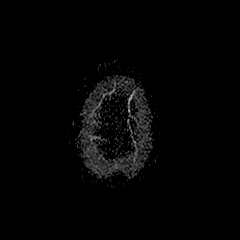

[Series 204: (id) · axial · 5.0mm · 1.00mm/px · z∈[-74,+80]mm · 2 of 27 slices shown (1 of 2)]
[im 1/27]
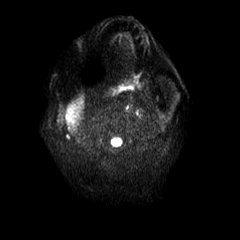
[im 27/27]
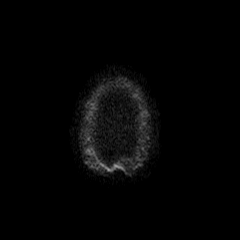

[Series 303: dadc map · coronal · 5.0mm · 0.81mm/px · 3 of 29 slices shown (2 of 2)]
[im 1/29]
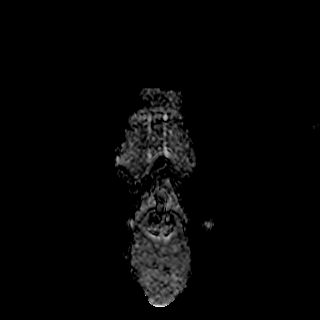
[im 15/29]
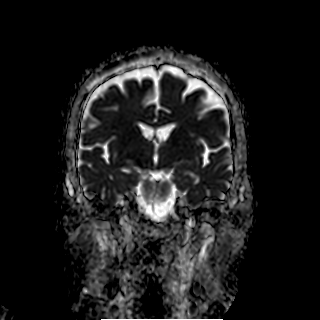
[im 29/29]
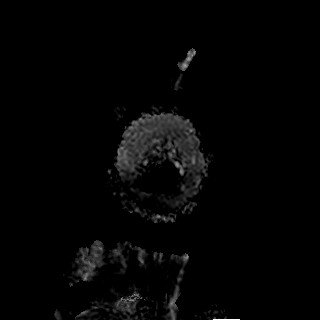

[Series 304: (id) · coronal · 5.0mm · 0.81mm/px · 3 of 31 slices shown (2 of 2)]
[im 1/31]
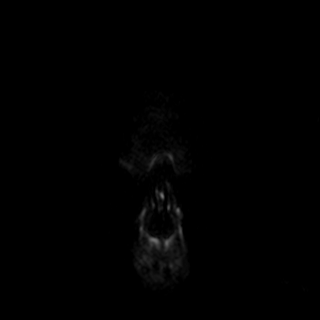
[im 16/31]
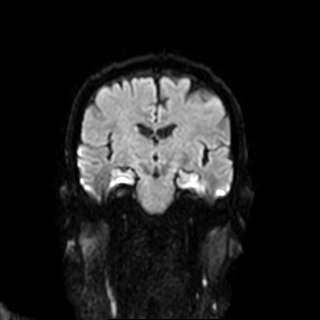
[im 31/31]
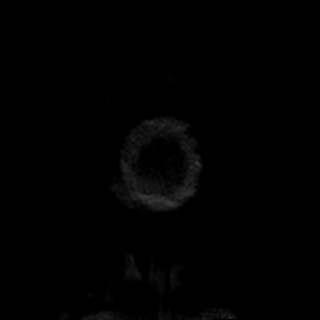

[Series 401: t1_se_sag · sagittal · 4.0mm · 0.43mm/px · 3 of 29 slices shown]
[im 1/29]
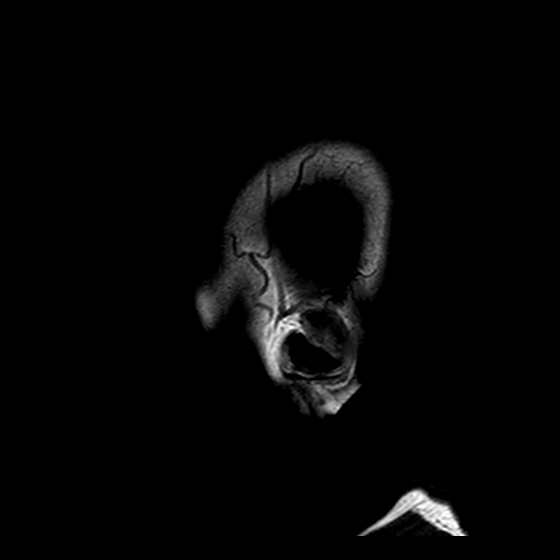
[im 15/29]
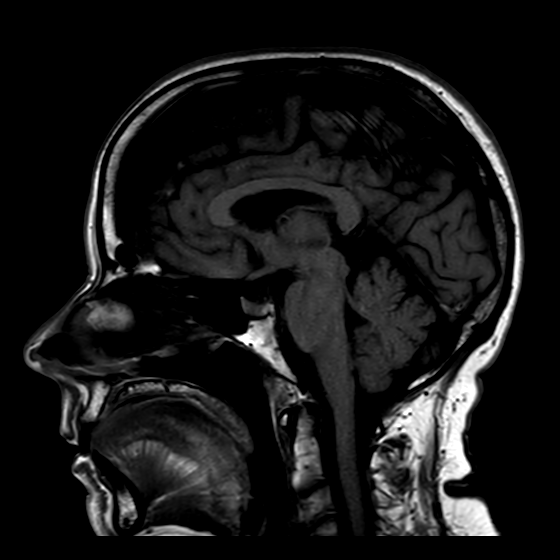
[im 29/29]
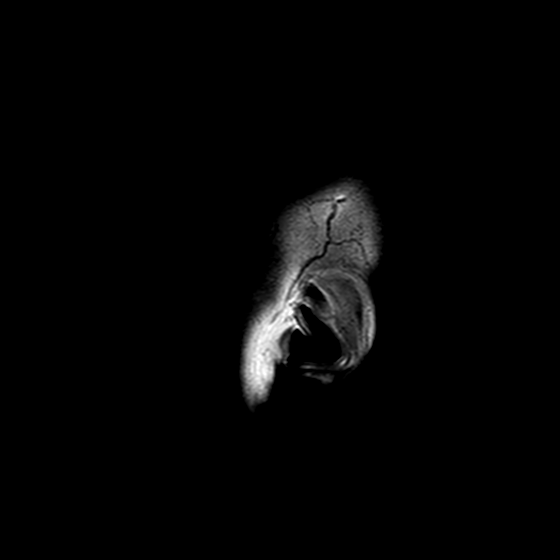

[Series 501: flair_ax+fs · axial · 5.0mm · 0.49mm/px · z∈[-68,+86]mm · 3 of 27 slices shown]
[im 1/27]
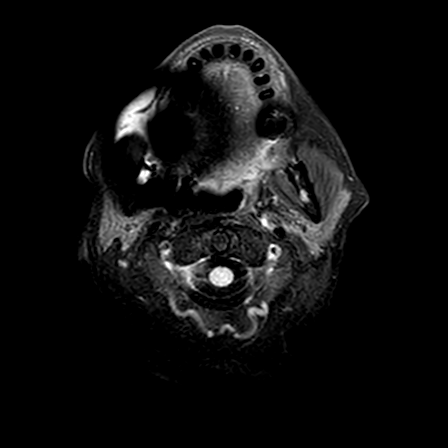
[im 14/27]
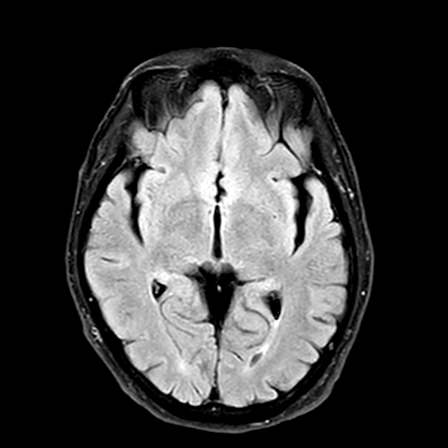
[im 27/27]
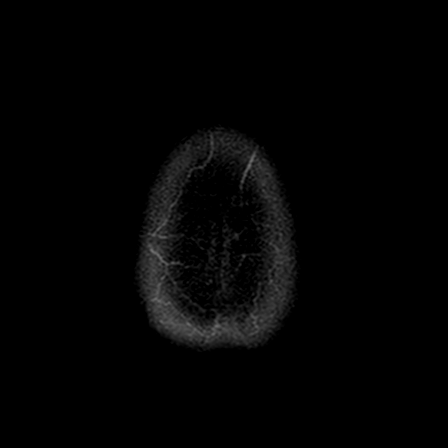

[Series 602: swip · axial · 10.0mm · 0.36mm/px · z∈[-52,+86]mm · 8 of 140 slices shown]
[im 1/140]
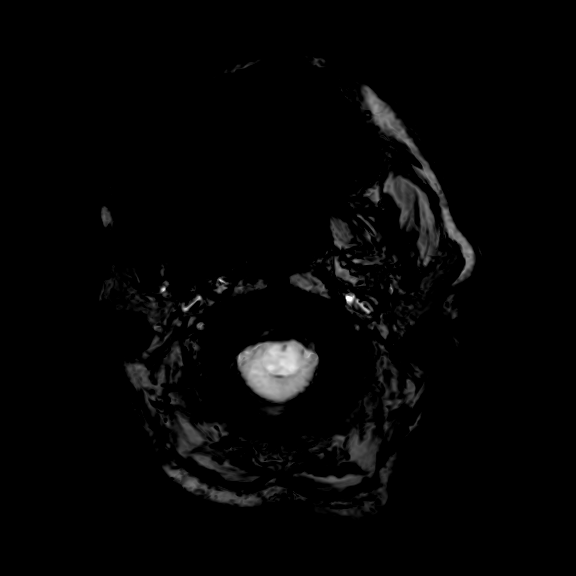
[im 22/140]
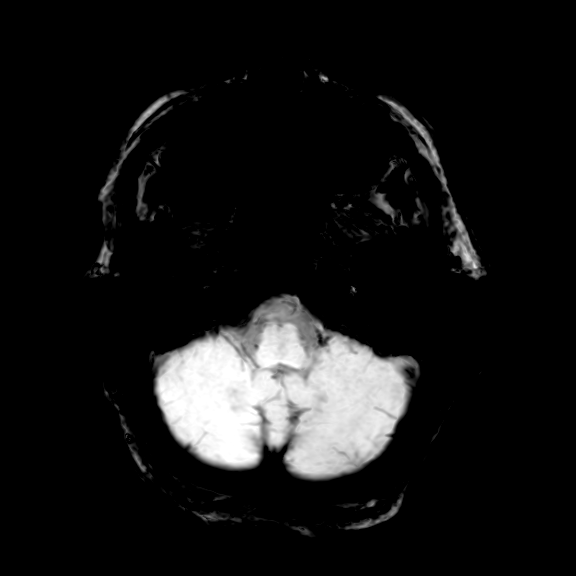
[im 43/140]
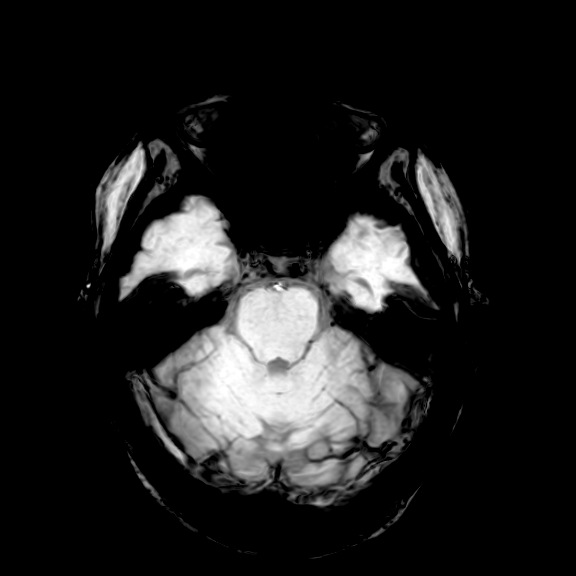
[im 65/140]
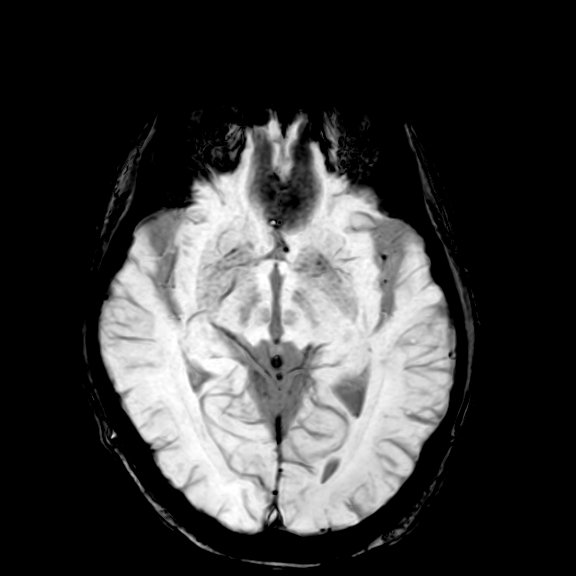
[im 75/140]
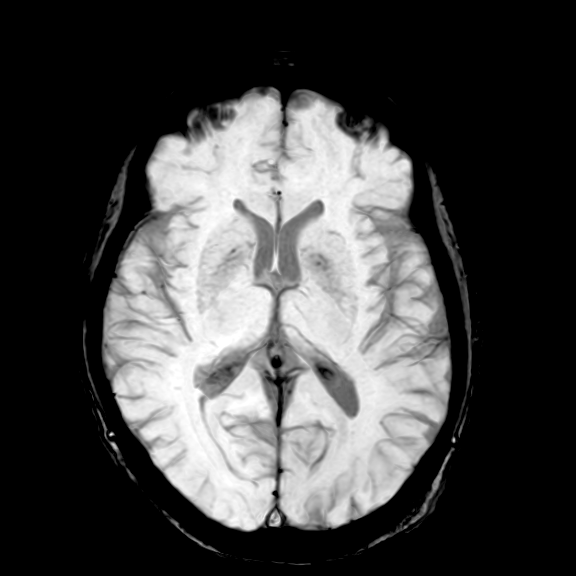
[im 97/140]
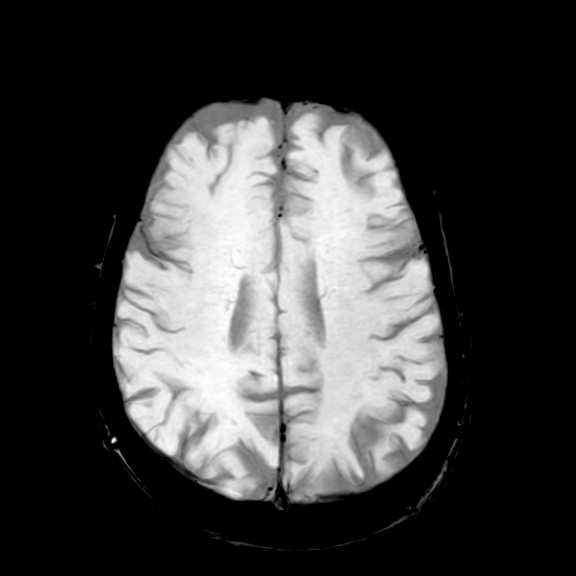
[im 118/140]
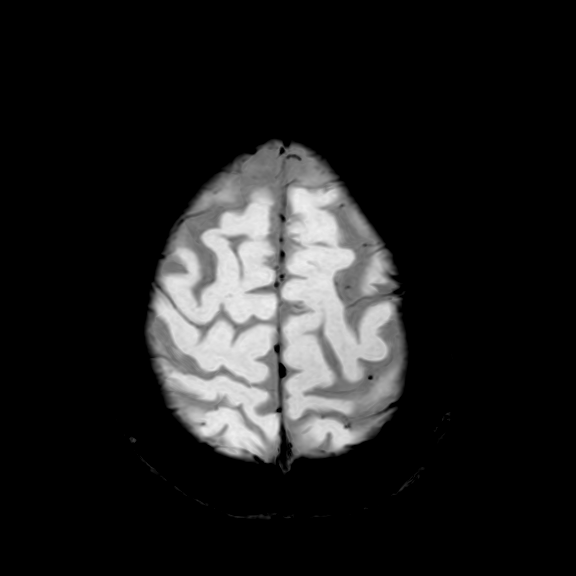
[im 140/140]
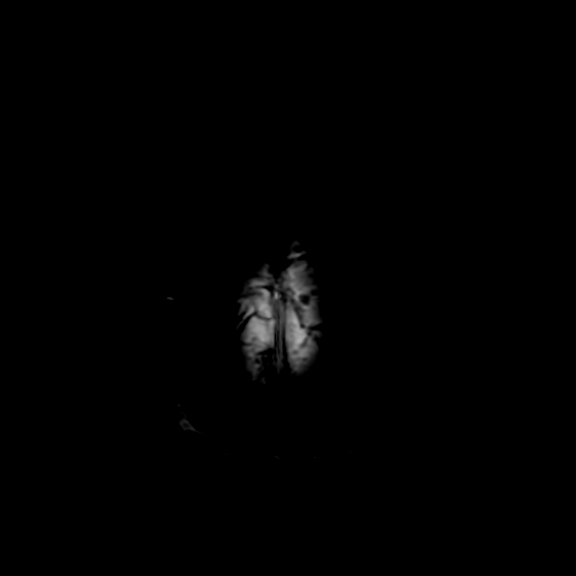

[25 of 48 positions shown; findings below may reference images not displayed]

FINDINGS: -------------------------------------------------------------------------------- 
------------------------- 
INTRACRANIAL: 
No acute ischemia. No abnormal foci of susceptibility artifact in the brain.  
Patency of intracranial vascular flow voids. No acute hemorrhage, midline shift, 
mass effect.   Cerebral volume is age appropriate.  No hydrocephalus.  Mild 
cerebellar tonsillar ectopia on the right side. 
-------------------------------------------------------------------------------- 
----------------------- 
OTHER: 
ORBITS/SINUSES/T-BONES:  Visualized orbits show no acute abnormality or mass.  
Mastoid air cells and middle ear cavities are grossly clear.  Visualized 
paranasal sinuses are clear. 
MARROW SIGNAL/SOFT TISSUES: No focal suspect signal abnormality.  
-------------------------------------------------------------------------------- 
-------------------
IMPRESSION: 1.  Essentially unremarkable MRI brain noting mild cerebellar tonsillar ectopia 
on the right side of doubtful clinical significance. 
2.  If there is clinical concern for Alzheimers disease, consider amyloid 
PET/CT for further assessment.

## 2024-01-19 DEATH — deceased

## 4701-05-18 DEATH — deceased
